# Patient Record
Sex: Male | Born: 1975 | Race: White | Hispanic: No | Marital: Single | Smoking: Former smoker
Health system: Southern US, Community
[De-identification: ages and names within clinical notes are randomized; demographics above are authoritative.]

## PROBLEM LIST (undated history)

## (undated) DIAGNOSIS — M549 Dorsalgia, unspecified: Secondary | ICD-10-CM

## (undated) DIAGNOSIS — F1010 Alcohol abuse, uncomplicated: Secondary | ICD-10-CM

## (undated) DIAGNOSIS — F1910 Other psychoactive substance abuse, uncomplicated: Secondary | ICD-10-CM

## (undated) DIAGNOSIS — I10 Essential (primary) hypertension: Secondary | ICD-10-CM

## (undated) DIAGNOSIS — K219 Gastro-esophageal reflux disease without esophagitis: Secondary | ICD-10-CM

## (undated) DIAGNOSIS — K759 Inflammatory liver disease, unspecified: Secondary | ICD-10-CM

## (undated) HISTORY — DX: Dorsalgia, unspecified: M54.9

---

## 2006-10-24 ENCOUNTER — Emergency Department: Payer: Self-pay | Admitting: Emergency Medicine

## 2006-10-30 ENCOUNTER — Emergency Department: Payer: Self-pay

## 2006-11-01 ENCOUNTER — Emergency Department: Payer: Self-pay | Admitting: Internal Medicine

## 2009-01-02 ENCOUNTER — Emergency Department: Payer: Self-pay | Admitting: Emergency Medicine

## 2009-01-15 ENCOUNTER — Ambulatory Visit: Payer: Self-pay

## 2009-03-10 ENCOUNTER — Emergency Department: Payer: Self-pay

## 2009-08-02 ENCOUNTER — Emergency Department: Payer: Self-pay | Admitting: Emergency Medicine

## 2010-01-25 ENCOUNTER — Emergency Department: Payer: Self-pay | Admitting: Emergency Medicine

## 2010-02-27 ENCOUNTER — Emergency Department: Payer: Self-pay | Admitting: Emergency Medicine

## 2010-03-02 ENCOUNTER — Ambulatory Visit: Payer: Self-pay | Admitting: Orthopedic Surgery

## 2010-03-21 ENCOUNTER — Emergency Department: Payer: Self-pay | Admitting: Emergency Medicine

## 2011-08-04 ENCOUNTER — Emergency Department (HOSPITAL_COMMUNITY): Payer: 59

## 2011-08-04 ENCOUNTER — Encounter (HOSPITAL_COMMUNITY): Payer: Self-pay

## 2011-08-04 ENCOUNTER — Observation Stay (HOSPITAL_COMMUNITY)
Admission: EM | Admit: 2011-08-04 | Discharge: 2011-08-05 | DRG: 918 | Disposition: A | Discharge status: Home / Self Care | Payer: 59 | Attending: Family Medicine | Admitting: Family Medicine

## 2011-08-04 DIAGNOSIS — T426X4A Poisoning by other antiepileptic and sedative-hypnotic drugs, undetermined, initial encounter: Secondary | ICD-10-CM

## 2011-08-04 DIAGNOSIS — Z720 Tobacco use: Secondary | ICD-10-CM

## 2011-08-04 DIAGNOSIS — F17200 Nicotine dependence, unspecified, uncomplicated: Secondary | ICD-10-CM | POA: Insufficient documentation

## 2011-08-04 DIAGNOSIS — F1010 Alcohol abuse, uncomplicated: Secondary | ICD-10-CM | POA: Diagnosis present

## 2011-08-04 DIAGNOSIS — T4271XA Poisoning by unspecified antiepileptic and sedative-hypnotic drugs, accidental (unintentional), initial encounter: Secondary | ICD-10-CM

## 2011-08-04 DIAGNOSIS — Y92009 Unspecified place in unspecified non-institutional (private) residence as the place of occurrence of the external cause: Secondary | ICD-10-CM | POA: Insufficient documentation

## 2011-08-04 DIAGNOSIS — M549 Dorsalgia, unspecified: Secondary | ICD-10-CM | POA: Diagnosis present

## 2011-08-04 DIAGNOSIS — T40601A Poisoning by unspecified narcotics, accidental (unintentional), initial encounter: Principal | ICD-10-CM | POA: Diagnosis present

## 2011-08-04 HISTORY — DX: Other psychoactive substance abuse, uncomplicated: F19.10

## 2011-08-04 HISTORY — DX: Alcohol abuse, uncomplicated: F10.10

## 2011-08-04 LAB — CBC WITH DIFFERENTIAL/PLATELET
Basophils Absolute: 0 10*3/uL (ref 0.0–0.1)
Basophils Relative: 0 % (ref 0–1)
Eosinophils Absolute: 0.1 10*3/uL (ref 0.0–0.7)
HCT: 39.7 % (ref 39.0–52.0)
Hemoglobin: 13.7 g/dL (ref 13.0–17.0)
MCH: 30.1 pg (ref 26.0–34.0)
MCHC: 34.5 g/dL (ref 30.0–36.0)
Monocytes Absolute: 0.5 10*3/uL (ref 0.1–1.0)
Monocytes Relative: 3 % (ref 3–12)
Neutro Abs: 13.5 10*3/uL — ABNORMAL HIGH (ref 1.7–7.7)
RDW: 12.8 % (ref 11.5–15.5)

## 2011-08-04 LAB — ETHANOL: Alcohol, Ethyl (B): 78 mg/dL — ABNORMAL HIGH (ref 0–11)

## 2011-08-04 LAB — COMPREHENSIVE METABOLIC PANEL
AST: 40 U/L — ABNORMAL HIGH (ref 0–37)
Albumin: 4.4 g/dL (ref 3.5–5.2)
BUN: 15 mg/dL (ref 6–23)
Calcium: 8.8 mg/dL (ref 8.4–10.5)
Chloride: 102 mEq/L (ref 96–112)
Creatinine, Ser: 1.07 mg/dL (ref 0.50–1.35)
Total Protein: 7.7 g/dL (ref 6.0–8.3)

## 2011-08-04 LAB — RAPID URINE DRUG SCREEN, HOSP PERFORMED
Amphetamines: NOT DETECTED
Benzodiazepines: NOT DETECTED
Cocaine: NOT DETECTED
Opiates: NOT DETECTED
Tetrahydrocannabinol: NOT DETECTED

## 2011-08-04 LAB — ACETAMINOPHEN LEVEL: Acetaminophen (Tylenol), Serum: <15 ug/mL (ref 10–30)

## 2011-08-04 MED ORDER — NALOXONE HCL 0.4 MG/ML IJ SOLN
0.2500 mg/h | INTRAVENOUS | Status: DC
  Administered 2011-08-04: 0.25 mg/h via INTRAVENOUS
  Filled 2011-08-04: qty 2.5

## 2011-08-04 MED ORDER — NALOXONE HCL 0.4 MG/ML IJ SOLN
INTRAMUSCULAR | Status: AC
  Filled 2011-08-04: qty 2

## 2011-08-04 NOTE — ED Provider Notes (Signed)
36 year old male was brought in by EMS after being found unresponsive. He relates that he took 2 Roxicodone 20 mg tablets and had consumed 8 beers. EMS reports that he was apneic or only minimal respirations which improved slightly with the first dose of Narcan 2 mg and then improved dramatically the second dose of Narcan 2 mg. Patient denies other drug use and denies other ingestion. He states he normally does not take the Roxicodone. He is currently awake, alert, oriented with an unremarkable physical exam. However, half-life of Roxicodone is much greater than the half-life of Narcan, so he will need to be admitted to a step down unit on a Narcan drip until it has had a chance to metabolize.  ECG shows normal sinus rhythm with a rate of 87, no ectopy. Normal axis. Normal P wave. Normal QRS. Normal intervals. Normal ST and T waves. Impression: normal ECG. No prior ECG available for comparison.   Dione Booze, MD 08/06/11 2255

## 2011-08-04 NOTE — ED Provider Notes (Signed)
History     CSN: [id number]  Arrival date & time 08/04/11  1946   First MD Initiated Contact with Patient 08/04/11 1950      Chief Complaint  Patient presents with  . Drug Overdose    (Consider location/radiation/quality/duration/timing/severity/associated sxs/prior treatment) Patient is a 36 y.o. male presenting with Overdose. The history is provided by the patient and the EMS personnel.  Drug Overdose This is a new problem. The current episode started today. Episode frequency: once. The problem has been rapidly improving. Associated symptoms include nausea. Pertinent negatives include no abdominal pain, chest pain, chills, diaphoresis, fever, headaches, myalgias, neck pain, numbness, rash, urinary symptoms, vertigo, vomiting or weakness. Nothing aggravates the symptoms. Treatments tried: Pt given Narcan 2 mg with minimal response and an additional 2 mg with significant response via EMS. The treatment provided significant relief.  Pt reports taking Roxicodone and drinking 8 beers this afternoon.  He was found unresponsive by wife.  Pt without respiratory efforts at time of EMS arrival.  BVM was applied.  Pt had nml pulse.  He was given Narcan 2 mg with minimal response.  He was then given Narcan 4 mg with significant response.  Pt alert at time of arrival to ED.  Past Medical History  Diagnosis Date  . ETOH abuse   . Polysubstance abuse     History reviewed. No pertinent past surgical history.  No family history on file.  History  Substance Use Topics  . Smoking status: Current Everyday Smoker  . Smokeless tobacco: Not on file  . Alcohol Use: Yes      Review of Systems  Constitutional: Negative for fever, chills and diaphoresis.  HENT: Negative for neck pain and neck stiffness.   Eyes: Negative for visual disturbance.  Respiratory: Negative.   Cardiovascular: Negative for chest pain and palpitations.  Gastrointestinal: Positive for nausea. Negative for vomiting and  abdominal pain.  Genitourinary: Negative.   Musculoskeletal: Negative.  Negative for myalgias.  Skin: Negative for rash.  Neurological: Negative for dizziness, vertigo, syncope, weakness, numbness and headaches.  Psychiatric/Behavioral: Negative for suicidal ideas and self-injury.  All other systems reviewed and are negative.    Allergies  Review of patient's allergies indicates no known allergies.  Home Medications   Current Outpatient Rx  Name Route Sig Dispense Refill  . NAPROXEN 500 MG PO TABS Oral Take 500 mg by mouth 2 (two) times daily with a meal.    . NAPROXEN SODIUM 220 MG PO CAPS Oral Take 1 capsule by mouth 2 (two) times daily.      BP 113/68  Pulse 73  Temp 97.2 F (36.2 C) (Oral)  Resp 14  SpO2 96%  Physical Exam  Nursing note and vitals reviewed. Constitutional: He is oriented to person, place, and time. He appears well-developed and well-nourished. No distress. Cervical collar and backboard in place.  HENT:  Head: Normocephalic and atraumatic.  Eyes: Conjunctivae and EOM are normal. Pupils are equal, round, and reactive to light.  Neck: Neck supple. No spinous process tenderness and no muscular tenderness present.  Cardiovascular: Normal rate, regular rhythm, normal heart sounds and intact distal pulses.   Pulmonary/Chest: Effort normal and breath sounds normal. He has no wheezes. He has no rales. He exhibits no tenderness.  Abdominal: Soft. He exhibits no distension. There is no tenderness.  Musculoskeletal: Normal range of motion. He exhibits no edema and no tenderness.       Cervical back: Normal.       Thoracic back:  Normal.       Lumbar back: Normal.  Neurological: He is alert and oriented to person, place, and time.  Skin: Skin is warm and dry.  Psychiatric: He expresses no suicidal ideation.    ED Course  Procedures (including critical care time)  Labs Reviewed  CBC WITH DIFFERENTIAL - Abnormal; Notable for the following:    WBC 15.0 (*)      Neutrophils Relative 90 (*)     Neutro Abs 13.5 (*)     Lymphocytes Relative 6 (*)     All other components within normal limits  COMPREHENSIVE METABOLIC PANEL - Abnormal; Notable for the following:    Glucose, Bld 181 (*)     AST 40 (*)     ALT 91 (*)     Total Bilirubin 0.2 (*)     GFR calc non Af Amer 88 (*)     All other components within normal limits  SALICYLATE LEVEL - Abnormal; Notable for the following:    Salicylate Lvl <2.0 (*)     All other components within normal limits  ETHANOL - Abnormal; Notable for the following:    Alcohol, Ethyl (B) 78 (*)     All other components within normal limits  ACETAMINOPHEN LEVEL  URINE RAPID DRUG SCREEN (HOSP PERFORMED)   Dg Chest 2 View  08/04/2011  *RADIOLOGY REPORT*  Clinical Data: Drug overdose.  Cigarette smoker.  CHEST - 2 VIEW  Comparison: None.  Findings:  Cardiopericardial silhouette within normal limits. Mediastinal contours normal. Trachea midline.  No airspace disease or effusion.  IMPRESSION: No active cardiopulmonary disease.  Original Report Authenticated By: Andreas Newport, M.D.     1. Narcotic overdose       MDM  36 yo male who presents to the ED via EMS for narcotic overdose.  Pt reports taking Roxicodone and drinking 8 beers this afternoon.  He was found unresponsive by wife.  Pt without respiratory efforts at time of EMS arrival.  BVM was applied.  Pt had nml pulse.  He was given Narcan 2 mg with minimal response.  He was then given Narcan 4 mg with significant response.  Pt alert and oriented at time of arrival to ED.  AF, VSS.  Physical exam as documented above.  No injuries identified.  Will get labs including LFT's, acetaminophen, salicylate, EtOH levels.  Due to concern for relapsing sx will start on Narcan gtt.    LFT's mildly elevated possibly secondary to EtOH abuse as acetaminophen level negative.  Pt has remained alert and oriented during ED stay.  Pt will require observation for relapsing sx and will admit  to step-down unit.        Cherre Robins, MD 08/05/11 [phone]

## 2011-08-04 NOTE — ED Notes (Signed)
Pharmacy notified for Narcan infusion

## 2011-08-04 NOTE — ED Notes (Signed)
Wife went to pick patient up from work and patient was violent and belligerent with her. Wife drove off and returned 5-10 minutes later to find patient on sidewalk, unresponsive and not breathing. CPR initiated by wife. At EMS arrival, patient was found to be unresponsive with a strong carotid pulse and RR of 0. Nasal trumpet inserted and assisted with ventilations. Narcan 4 mg given. Patient immediately AAOx4. Admits to consuming 2- 10 mg tablets of Roxicodone and several beers. Patient has a hx ETOH and polysubstance abuse. Patient has no complaints of pain or discomfort. No obvious injuries or deformities noted.

## 2011-08-04 NOTE — ED Notes (Signed)
Admitting MD at bedside.

## 2011-08-05 LAB — CBC
MCV: 88 fL (ref 78.0–100.0)
Platelets: 193 10*3/uL (ref 150–400)
RBC: 4.43 MIL/uL (ref 4.22–5.81)
WBC: 6.4 10*3/uL (ref 4.0–10.5)

## 2011-08-05 LAB — BASIC METABOLIC PANEL
CO2: 27 mEq/L (ref 19–32)
Calcium: 9 mg/dL (ref 8.4–10.5)
Chloride: 103 mEq/L (ref 96–112)
GFR calc Af Amer: >90 mL/min (ref >90)
Sodium: 138 mEq/L (ref 135–145)

## 2011-08-05 MED ORDER — SODIUM CHLORIDE 0.9 % IJ SOLN
3.0000 mL | Freq: Two times a day (BID) | INTRAMUSCULAR | Status: DC
  Administered 2011-08-05: 3 mL via INTRAVENOUS

## 2011-08-05 MED ORDER — ONDANSETRON HCL 4 MG/2ML IJ SOLN: 4.0000 mg | Freq: Four times a day (QID) | INTRAMUSCULAR | Status: DC | PRN

## 2011-08-05 MED ORDER — ONDANSETRON HCL 4 MG PO TABS: 4.0000 mg | ORAL_TABLET | Freq: Four times a day (QID) | ORAL | Status: DC | PRN

## 2011-08-05 MED ORDER — SODIUM CHLORIDE 0.9 % IV SOLN
INTRAVENOUS | Status: DC
  Administered 2011-08-05: 01:00:00 via INTRAVENOUS

## 2011-08-05 NOTE — H&P (Signed)
Thomas Dorsey is an 36 y.o. male.   Chief Complaint: Drug overdose HPI: A 36 year old gentleman brought in by his wife with altered mental status. Patient apparently took some oxycodone tonight. He also took 8 beers in the same period. He was completely obtunded when he came to the emergency room. He known to have polysubstance abuse. Patient was given Narcan on the way to the hospital. And was also started on narcan drip in the ER. He is now awake most of the weak and slightly drowsy. Patient is being admitted for observation.  Past Medical History  Diagnosis Date  . ETOH abuse   . Polysubstance abuse     History reviewed. No pertinent past surgical history.  No family history on file. Social History:  reports that he has been smoking.  He does not have any smokeless tobacco history on file. He reports that he drinks alcohol. His drug history not on file.  Allergies: No Known Allergies  Medications Prior to Admission  Medication Sig Dispense Refill  . naproxen (NAPROSYN) 500 MG tablet Take 500 mg by mouth 2 (two) times daily with a meal.      . Naproxen Sodium (ALEVE) 220 MG CAPS Take 1 capsule by mouth 2 (two) times daily.        Results for orders placed during the hospital encounter of 08/04/11 (from the past 48 hour(s))  URINE RAPID DRUG SCREEN (HOSP PERFORMED)     Status: Normal   Collection Time   08/04/11  8:00 PM      Component Value Range Comment   Opiates NONE DETECTED  NONE DETECTED    Cocaine NONE DETECTED  NONE DETECTED    Benzodiazepines NONE DETECTED  NONE DETECTED    Amphetamines NONE DETECTED  NONE DETECTED    Tetrahydrocannabinol NONE DETECTED  NONE DETECTED    Barbiturates NONE DETECTED  NONE DETECTED   CBC WITH DIFFERENTIAL     Status: Abnormal   Collection Time   08/04/11  8:02 PM      Component Value Range Comment   WBC 15.0 (*) 4.0 - 10.5 K/uL    RBC 4.55  4.22 - 5.81 MIL/uL    Hemoglobin 13.7  13.0 - 17.0 g/dL    HCT 40.9  81.1 - 91.4 %    MCV 87.3   78.0 - 100.0 fL    MCH 30.1  26.0 - 34.0 pg    MCHC 34.5  30.0 - 36.0 g/dL    RDW 78.2  95.6 - 21.3 %    Platelets 204  150 - 400 K/uL    Neutrophils Relative 90 (*) 43 - 77 %    Neutro Abs 13.5 (*) 1.7 - 7.7 K/uL    Lymphocytes Relative 6 (*) 12 - 46 %    Lymphs Abs 0.9  0.7 - 4.0 K/uL    Monocytes Relative 3  3 - 12 %    Monocytes Absolute 0.5  0.1 - 1.0 K/uL    Eosinophils Relative 0  0 - 5 %    Eosinophils Absolute 0.1  0.0 - 0.7 K/uL    Basophils Relative 0  0 - 1 %    Basophils Absolute 0.0  0.0 - 0.1 K/uL   COMPREHENSIVE METABOLIC PANEL     Status: Abnormal   Collection Time   08/04/11  8:02 PM      Component Value Range Comment   Sodium 139  135 - 145 mEq/L    Potassium 3.5  3.5 - 5.1  mEq/L    Chloride 102  96 - 112 mEq/L    CO2 21  19 - 32 mEq/L    Glucose, Bld 181 (*) 70 - 99 mg/dL    BUN 15  6 - 23 mg/dL    Creatinine, Ser 1.61  0.50 - 1.35 mg/dL    Calcium 8.8  8.4 - 09.6 mg/dL    Total Protein 7.7  6.0 - 8.3 g/dL    Albumin 4.4  3.5 - 5.2 g/dL    AST 40 (*) 0 - 37 U/L    ALT 91 (*) 0 - 53 U/L    Alkaline Phosphatase 92  39 - 117 U/L    Total Bilirubin 0.2 (*) 0.3 - 1.2 mg/dL    GFR calc non Af Amer 88 (*) >90 mL/min    GFR calc Af Amer >90  >90 mL/min   ACETAMINOPHEN LEVEL     Status: Normal   Collection Time   08/04/11  8:02 PM      Component Value Range Comment   Acetaminophen (Tylenol), Serum <15.0  10 - 30 ug/mL   SALICYLATE LEVEL     Status: Abnormal   Collection Time   08/04/11  8:02 PM      Component Value Range Comment   Salicylate Lvl <2.0 (*) 2.8 - 20.0 mg/dL   ETHANOL     Status: Abnormal   Collection Time   08/04/11  9:14 PM      Component Value Range Comment   Alcohol, Ethyl (B) 78 (*) 0 - 11 mg/dL    Dg Chest 2 View  0/04/5407  *RADIOLOGY REPORT*  Clinical Data: Drug overdose.  Cigarette smoker.  CHEST - 2 VIEW  Comparison: None.  Findings:  Cardiopericardial silhouette within normal limits. Mediastinal contours normal. Trachea midline.  No  airspace disease or effusion.  IMPRESSION: No active cardiopulmonary disease.  Original Report Authenticated By: Andreas Newport, M.D.    Review of Systems  Constitutional: Negative.   HENT: Negative.   Eyes: Negative.   Respiratory: Negative.   Cardiovascular: Negative.   Gastrointestinal: Negative.   Genitourinary: Negative.   Musculoskeletal: Negative.   Skin: Negative.   Neurological: Positive for loss of consciousness.  Endo/Heme/Allergies: Negative.   Psychiatric/Behavioral: Negative.     Blood pressure 106/64, pulse 68, temperature 97.8 F (36.6 C), temperature source Oral, resp. rate 16, height 5\' 11"  (1.803 m), weight 77.6 kg (171 lb 1.2 oz), SpO2 98.00%. Physical Exam  Constitutional: He is oriented to person, place, and time. He appears well-developed and well-nourished.       disshevelled  HENT:  Head: Normocephalic and atraumatic.  Right Ear: External ear normal.  Left Ear: External ear normal.  Nose: Nose normal.  Mouth/Throat: Oropharynx is clear and moist.  Eyes: Conjunctivae and EOM are normal. Pupils are equal, round, and reactive to light.  Neck: Normal range of motion. Neck supple.  Cardiovascular: Normal rate, regular rhythm, normal heart sounds and intact distal pulses.   Respiratory: Effort normal and breath sounds normal.  GI: Soft. Bowel sounds are normal.  Musculoskeletal: Normal range of motion.  Neurological: He is alert and oriented to person, place, and time. He has normal reflexes.  Skin: Skin is warm and dry.  Psychiatric: He has a normal mood and affect. His behavior is normal. Judgment and thought content normal.     Assessment/Plan This 36 year old gentleman with narcotic overdose, alcohol abuse and tobacco abuse. He had inadvertent overdose of his narcotics. He obtained from a friend  but has apparently been on Percocet for low back pain.   Plan #1 narcotic overdose: The patient is now awake after narcan. We will also overnight and if he is  stable then we will discharge him home. Counseling will be given once again regarding substance use and abuse.  #2 Alcohol abuse: Counseling has also been provided. We will check for possible alcohol withdrawal if so we'll start the CIWA protocol.  #3 tobacco abuse: Patient has been counseled and offered him nicotine patch.  #4 history of low back pain: This is the reason that led the patient to take narcotics. Will advise patient to follow up with his primary care physician and to abide by the treatment plan GARBA,LAWAL 08/05/2011, 6:03 AM

## 2011-08-05 NOTE — Progress Notes (Signed)
Utilization review complete 

## 2011-08-05 NOTE — Progress Notes (Signed)
Pts assessment unchanged from this am. D/c'd home with family in stable condition

## 2011-08-05 NOTE — Clinical Social Work Psychosocial (Addendum)
    Clinical Social Work Department BRIEF PSYCHOSOCIAL ASSESSMENT 08/05/2011  Patient:  Yerger,CHRIS     Account Number:  [id number]     Admit date:  08/04/2011  Clinical Social Worker:  Doree Albee  Date/Time:  08/05/2011 02:35 PM  Referred by:  RN  Date Referred:  08/05/2011 Referred for  Substance Abuse   Other Referral:   Interview type:  Patient Other interview type:    PSYCHOSOCIAL DATA Living Status:  FAMILY Admitted from facility:   Level of care:   Primary support name:  Rhinehardt,Dawn Primary support relationship to patient:  SPOUSE Degree of support available:   strong    CURRENT CONCERNS Current Concerns  Other - See comment   Other Concerns:   substance abuse, alcohol and narcotics    SOCIAL WORK ASSESSMENT / PLAN CSW met with pt at bedside along with pt spouse with permission from pt. CSW and pt discussed the incident of patient hosptalization. Pt shared he had been drinking 7 beers when he took pain medicine from a coworker. Pt stated that he is usually prescirbed percocet hoewver had run out so a coworker gave him some roxy.    Pt and csw discussed the dangers of taking medications not prescribed to the patient with allergies, medicaiton interractions, and the dosage. CSW and pt also discussed the concerns of taking narcotics and alcohol. 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    CSW completed SBIRT with patient.    Pt denied having a current substance abuse problem and that it was an accident. Pt denied substance abuse resources. Pt denied suicidal or homicidal ideations. Pt denied all symptoms of depression.    Assessment/plan status:  No Further Intervention Required Other assessment/ plan:   Information/referral to community resources:   Offered substance abuse resources, pt declined     PATIENT'S/FAMILY'S RESPONSE TO PLAN OF CARE: Pt thanked csw for concern and support. Pt agreed to obstain from drinking alcohol with pain medicine.  Pt adamantly stated taking that much narcotic and drinking was an accident that got out of hand.

## 2011-08-05 NOTE — Discharge Summary (Signed)
Physician Discharge Summary  Patient ID: Thomas Dorsey MRN: [id number] DOB/AGE: 02/10/1975 36 y.o.  Admit date: 08/04/2011 Discharge date: 08/05/2011  Discharge Diagnoses:  Principal Problem:  *Narcotic overdose Active Problems:  Alcohol abuse  Tobacco abuse  Back pain  Discharged Condition: good  Hospital Course: From H&P:  36 year old gentleman brought in by his wife with altered mental status. Patient apparently took some oxycodone tonight. He also took 8 beers in the same period. He was completely obtunded when he came to the emergency room. He known to have polysubstance abuse. Patient was given Narcan on the way to the hospital. And was also started on narcan drip in the ER. He is now awake most of the weak and slightly drowsy. Patient is being admitted for observation. This 36 year old gentleman with narcotic overdose, alcohol abuse and tobacco abuse. He had inadvertent overdose of his narcotics. He obtained from a friend but has apparently been on Percocet for low back pain.   #1 narcotic overdose: The patient is now awake after narcan. We monitored him overnight and he remained stable. Will discharge home. Counseled to avoid tobacco, recreational substance abuse and mixing prescription meds with alcohol.  Pt verbalized understanding.    #2 Alcohol abuse: Counseling has also been provided to avoid alcohol use. Pt exhibited no signs of alcohol withdrawal during the hospital stay. The patient verbalized understanding.    #3 tobacco abuse: Patient has been counseled and offered him nicotine patch.   #4 history of low back pain: This is the reason that led the patient to take narcotics. Will advise patient to follow up with his primary care physician and to abide by the treatment plan and avoid self-medicating. The patient verbalized understanding.    Discharge Exam: Pt is anxious to leave the hospital. He says that he feels fine and is planning to go on vacation with wife.  He denies  CP, SOB, or any other symptoms.   Blood pressure 102/58, pulse 71, temperature 98.1 F (36.7 C), temperature source Oral, resp. rate 18, height 5\' 11"  (1.803 m), weight 77.6 kg (171 lb 1.2 oz), SpO2 98.00%. General - no distress, calm, cooperative, A&Ox4 Heent - NCAT, MMM Neck - supple, no JVD Lungs - BBS cTA CV - normal s1, s2 sounds Abd - soft, nondistended, nontender, no masses Ext - no CCE Neuro - nonfocal  Disposition: Home with wife, close outpatient follow up recommended  Discharge Orders    Future Orders Please Complete By Expires   Increase activity slowly      Discharge instructions      Comments:   Make sure and follow up with your primary care physician in next 2 weeks time for follow up.  Avoid alcohol Return if symptoms recur, worsen or new problems develop.   Other Restrictions      Comments:   Avoid alcohol and avoid any recreational substances.     Medication List  As of 08/05/2011  1:20 PM   STOP taking these medications         ALEVE 220 MG Caps         TAKE these medications         naproxen 500 MG tablet   Commonly known as: NAPROSYN   Take 500 mg by mouth 2 (two) times daily with a meal.           Follow-up Information    Follow up with See your primary care physician. Schedule an appointment as soon as possible for a visit  in 2 weeks. [facility] Follow Up)         32 mins spent preparing discharge, counseling with patient  Signed: Standley Dakins MD 08/05/2011, 1:20 PM [facility] [place], [place]

## 2011-08-05 NOTE — ED Provider Notes (Signed)
CRITICAL CARE Performed by: Dione Booze   Total critical care time: 40 minutes  Critical care time was exclusive of separately billable procedures and treating other patients.  Critical care was necessary to treat or prevent imminent or life-threatening deterioration.  Critical care was time spent personally by me on the following activities: development of treatment plan with patient and/or surrogate as well as nursing, discussions with consultants, evaluation of patient's response to treatment, examination of patient, obtaining history from patient or surrogate, ordering and performing treatments and interventions, ordering and review of laboratory studies, ordering and review of radiographic studies, pulse oximetry and re-evaluation of patient's condition.   I saw and evaluated the patient, reviewed the resident's note and I agree with the findings and plan.   Dione Booze, MD 08/05/11 [phone]

## 2012-01-04 HISTORY — PX: HAND SURGERY: SHX662

## 2012-01-23 IMAGING — CR RIGHT HAND - COMPLETE 3+ VIEW
1 series · 3 of 3 positions shown · non-contrast
Comparison: none

REASON FOR EXAM: deformity
COMMENTS:

PROCEDURE:     DXR - DXR HAND RT COMPLETE W/OBLIQUES  - February 27, 2010 [DATE]
RESULT:
A fracture is identified along the distal shaft of the fourth metacarpal.
The distal fracture fragment demonstrates volar angulation and displacement.
No further fractures or dislocations are appreciated.

[Series 1: view not recorded · 0.17mm/px · 3 of 3 slices shown]
[im 1/3]
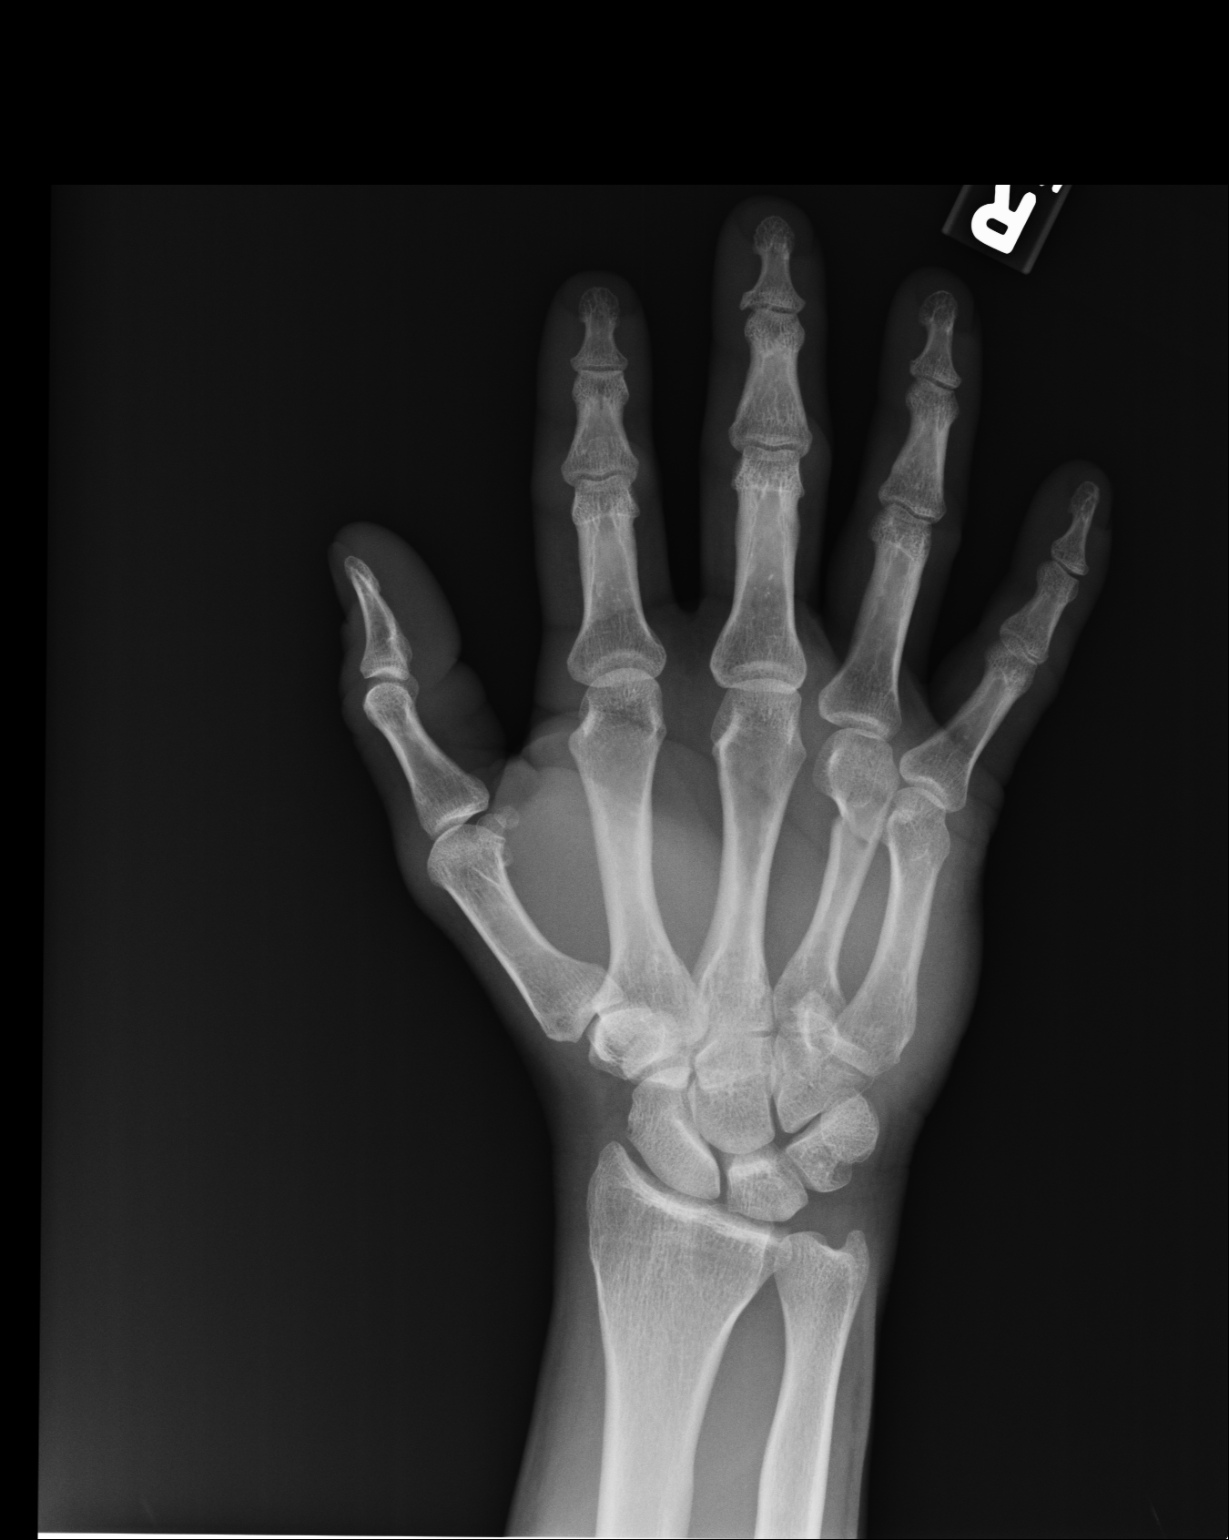
[im 2/3]
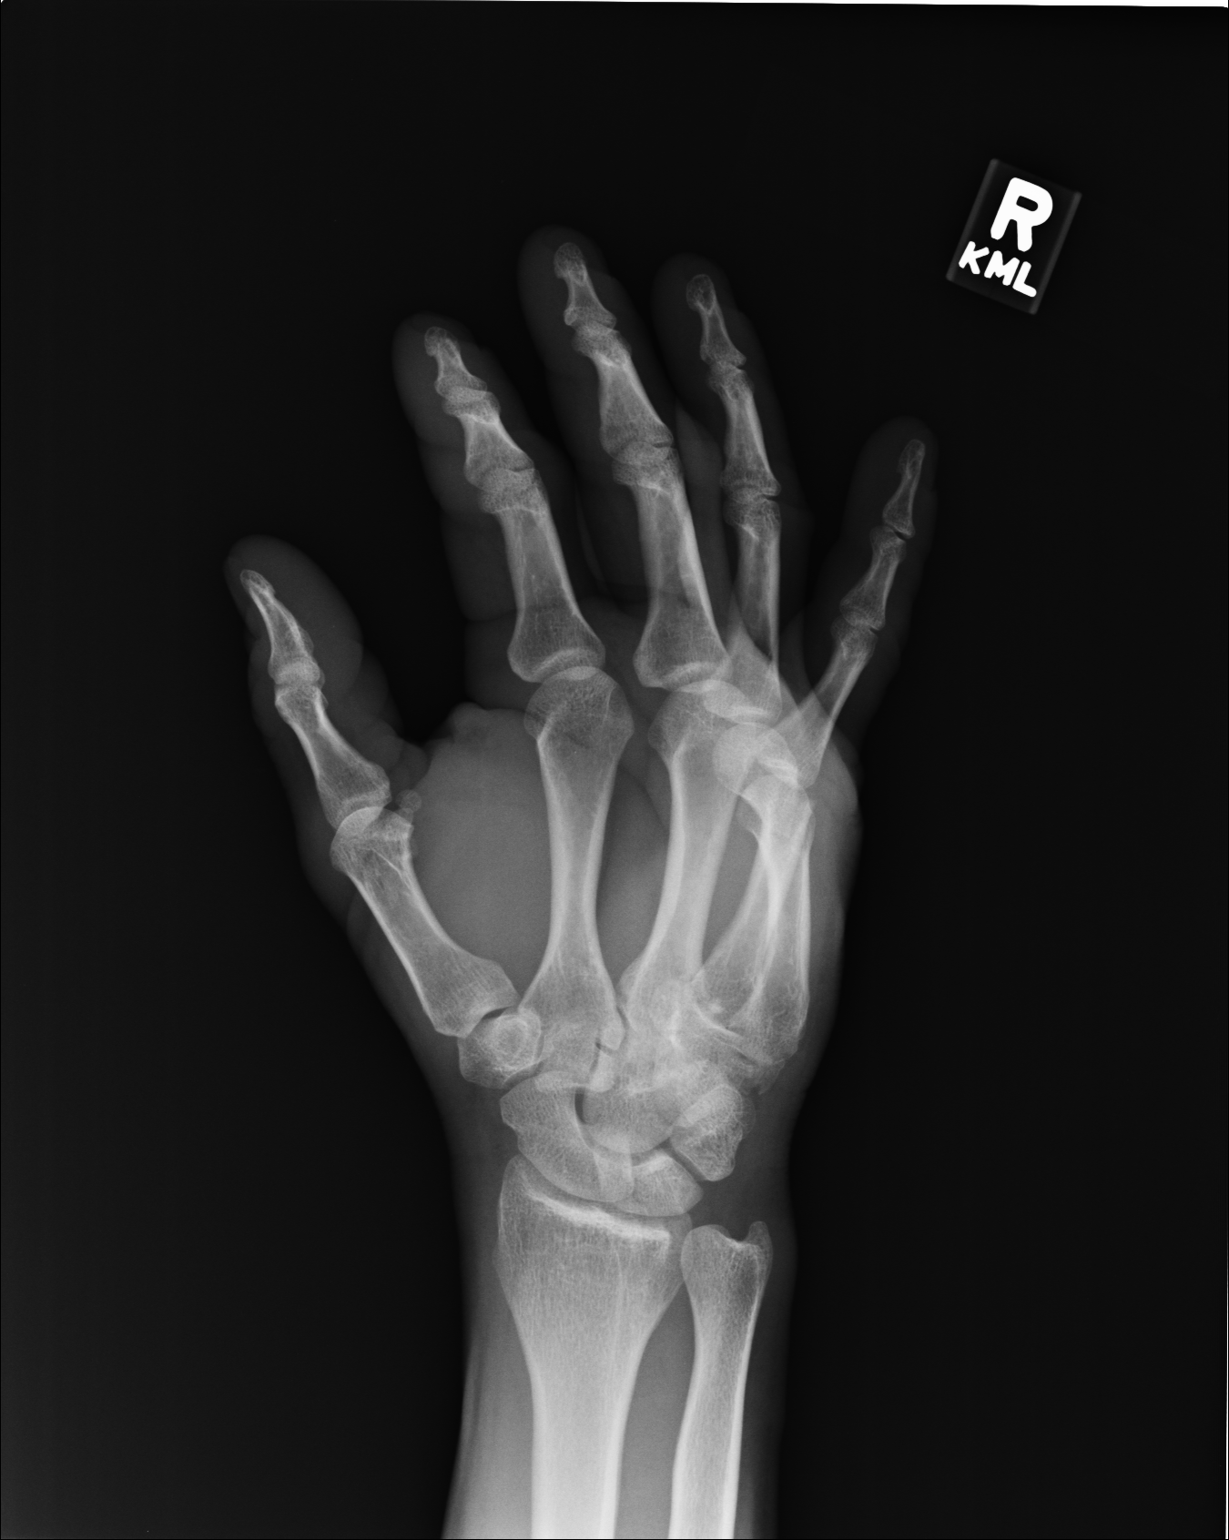
[im 3/3]
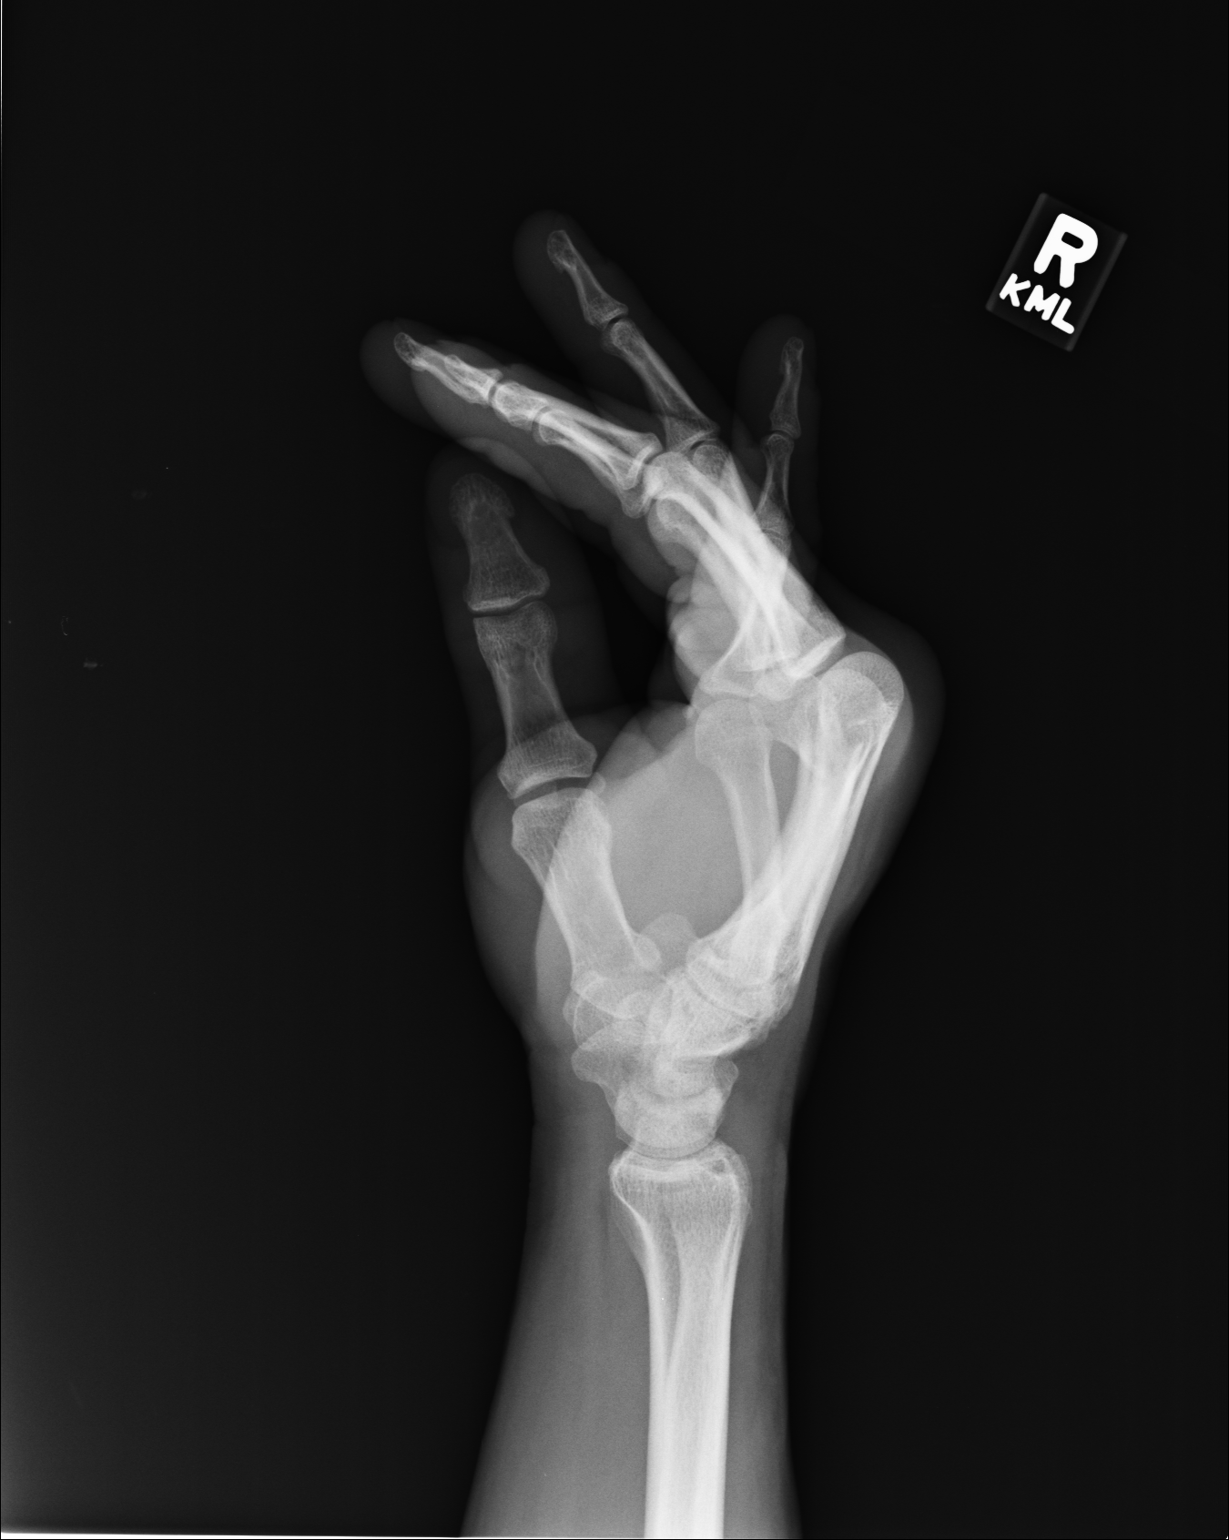

[3 of 3 positions shown; findings below may reference images not displayed]

IMPRESSION: 1. Distal fourth metacarpal fracture.

## 2012-06-21 ENCOUNTER — Emergency Department: Payer: Self-pay | Admitting: Emergency Medicine

## 2012-06-21 LAB — COMPREHENSIVE METABOLIC PANEL
Bilirubin,Total: 0.2 mg/dL (ref 0.2–1.0)
Calcium, Total: 8.1 mg/dL — ABNORMAL LOW (ref 8.5–10.1)
Co2: 23 mmol/L (ref 21–32)
Creatinine: 1.27 mg/dL (ref 0.60–1.30)
EGFR (African American): >60
Glucose: 179 mg/dL — ABNORMAL HIGH (ref 65–99)
Osmolality: 279 (ref 275–301)

## 2012-06-21 LAB — CBC
HGB: 13.4 g/dL (ref 13.0–18.0)
MCH: 30.5 pg (ref 26.0–34.0)
Platelet: 182 10*3/uL (ref 150–440)
RBC: 4.41 10*6/uL (ref 4.40–5.90)
RDW: 12.5 % (ref 11.5–14.5)
WBC: 4.8 10*3/uL (ref 3.8–10.6)

## 2012-06-21 LAB — ETHANOL
Ethanol %: 0.064 % (ref 0.000–0.080)
Ethanol: 64 mg/dL

## 2012-06-21 LAB — TROPONIN I: Troponin-I: <0.02 ng/mL

## 2012-06-21 LAB — CK: CK, Total: 81 U/L (ref 35–232)

## 2012-07-14 ENCOUNTER — Emergency Department (HOSPITAL_COMMUNITY)
Admission: EM | Admit: 2012-07-14 | Discharge: 2012-07-14 | Disposition: A | Discharge status: Home / Self Care | Payer: 59 | Attending: Emergency Medicine | Admitting: Emergency Medicine

## 2012-07-14 ENCOUNTER — Encounter (HOSPITAL_COMMUNITY): Payer: Self-pay | Admitting: Emergency Medicine

## 2012-07-14 ENCOUNTER — Emergency Department (HOSPITAL_COMMUNITY): Payer: 59

## 2012-07-14 DIAGNOSIS — Y9229 Other specified public building as the place of occurrence of the external cause: Secondary | ICD-10-CM | POA: Insufficient documentation

## 2012-07-14 DIAGNOSIS — Z791 Long term (current) use of non-steroidal anti-inflammatories (NSAID): Secondary | ICD-10-CM | POA: Insufficient documentation

## 2012-07-14 DIAGNOSIS — Z79899 Other long term (current) drug therapy: Secondary | ICD-10-CM | POA: Insufficient documentation

## 2012-07-14 DIAGNOSIS — G5631 Lesion of radial nerve, right upper limb: Secondary | ICD-10-CM

## 2012-07-14 DIAGNOSIS — F17200 Nicotine dependence, unspecified, uncomplicated: Secondary | ICD-10-CM | POA: Insufficient documentation

## 2012-07-14 DIAGNOSIS — G5630 Lesion of radial nerve, unspecified upper limb: Secondary | ICD-10-CM | POA: Insufficient documentation

## 2012-07-14 DIAGNOSIS — R112 Nausea with vomiting, unspecified: Secondary | ICD-10-CM | POA: Insufficient documentation

## 2012-07-14 DIAGNOSIS — R209 Unspecified disturbances of skin sensation: Secondary | ICD-10-CM | POA: Insufficient documentation

## 2012-07-14 DIAGNOSIS — S0093XA Contusion of unspecified part of head, initial encounter: Secondary | ICD-10-CM

## 2012-07-14 DIAGNOSIS — W1809XA Striking against other object with subsequent fall, initial encounter: Secondary | ICD-10-CM | POA: Insufficient documentation

## 2012-07-14 DIAGNOSIS — S0003XA Contusion of scalp, initial encounter: Secondary | ICD-10-CM | POA: Insufficient documentation

## 2012-07-14 DIAGNOSIS — Y9389 Activity, other specified: Secondary | ICD-10-CM | POA: Insufficient documentation

## 2012-07-14 NOTE — ED Notes (Signed)
Hit head on Tuesday when chair leg broke-- fell backward and hit head on dresser. States lost consciousness for an unknown amount of time-- bit tongue at same time-- today right hand feels numb and "fingers won't work" -- able to squeeze -- equal grips-- unable to flex wrist without pain and "makes fingers spasm" .  Also complains of head being numb -- occipital area - with small hematoma.

## 2012-07-14 NOTE — ED Provider Notes (Signed)
History    CSN: [id number] Arrival date & time 07/14/12  4098  None    Chief Complaint  Patient presents with  . right hand injury   . Head Injury   (Consider location/radiation/quality/duration/timing/severity/associated sxs/prior Treatment) Patient is a 37 y.o. male presenting with head injury.  Head Injury Associated symptoms: nausea, numbness and vomiting ( 1x tuesday)   Associated symptoms: no headaches    Pt is a 37yo male presenting today with right hand weakness and tingling that started this morning.  Pt reports a fall on Tuesday 7/8 when he was sitting in a hotel room chair, leg broke and he hit the back of his head on a desk.  Pt states he lost consciousness for an unknown amount of time, pt was alone.  States when he woke up his head was a little sore with a "goose egg" but did not feel like needed to come to the hospital at that time.  Reports 1 episode of NBNB emesis the next morning.  Today pt notice numbness and weakness in right head, unable to open fully.  Denies pain.  Denies additional injury to head but does report previous ORIF several years ago of right hand.  Has not had complications since initial hand injury.  Pt is right handed.   Past Medical History  Diagnosis Date  . ETOH abuse   . Polysubstance abuse    Past Surgical History  Procedure Laterality Date  . Hand sugery     No family history on file. History  Substance Use Topics  . Smoking status: Current Every Day Smoker -- 1.00 packs/day  . Smokeless tobacco: Not on file  . Alcohol Use: No     Comment: currently in recovery---- sober for 6 months    Review of Systems  Constitutional: Negative for fever and chills.  Eyes: Negative for visual disturbance.  Gastrointestinal: Positive for nausea and vomiting ( 1x tuesday).  Skin: Negative for wound.  Neurological: Positive for weakness ( right hand) and numbness. Negative for dizziness, light-headedness and headaches.  All other systems reviewed  and are negative.    Allergies  Review of patient's allergies indicates no known allergies.  Home Medications   Current Outpatient Rx  Name  Route  Sig  Dispense  Refill  . esomeprazole (NEXIUM) 40 MG capsule   Oral   Take 40 mg by mouth daily.         . naproxen (NAPROSYN) 500 MG tablet   Oral   Take 500 mg by mouth 2 (two) times daily with a meal.         . traMADol (ULTRAM) 50 MG tablet   Oral   Take 50 mg by mouth every 6 (six) hours as needed for pain.          BP 117/65  Pulse 80  Temp(Src) 98.7 F (37.1 C) (Oral)  Resp 16  SpO2 100% Physical Exam  Nursing note and vitals reviewed. Constitutional: He appears well-developed and well-nourished. No distress.  Pt resting comfortably on exam bed. NAD  HENT:  Head: Normocephalic and atraumatic.    Eyes: Conjunctivae are normal. No scleral icterus.  Neck: Normal range of motion. Neck supple.  No midline bone tenderness, no crepitus or step-offs.     Cardiovascular: Normal rate, regular rhythm and normal heart sounds.   Pulmonary/Chest: Effort normal and breath sounds normal.  Musculoskeletal: He exhibits no edema and no tenderness.       Right wrist: He exhibits decreased  range of motion ( limited extention of right hand at wrist). He exhibits no tenderness, no swelling, no crepitus, no deformity and no laceration.  Limited extension of right hand at wrist, worst in 3rd and 4th digit  Neurological: He is alert.  Skin: Skin is warm and dry. He is not diaphoretic.  Psychiatric: He has a normal mood and affect. His behavior is normal.    ED Course  Procedures (including critical care time) Labs Reviewed - No data to display Ct Head Wo Contrast  07/14/2012   *RADIOLOGY REPORT*  Clinical Data:  Fall.  Head injury.  Headache  CT HEAD WITHOUT CONTRAST  Technique:  Contiguous axial images were obtained from the base of the skull through the vertex without contrast  Comparison:  None.  Findings:  The brain has a  normal appearance without evidence for hemorrhage, acute infarction, hydrocephalus, or mass lesion.  There is no extra axial fluid collection.  The skull and paranasal sinuses are normal.  IMPRESSION: Normal CT of the head without contrast.   Original Report Authenticated By: Janeece Riggers, M.D.   Dg Hand Complete Right  07/14/2012   *RADIOLOGY REPORT*  Clinical Data: Fall.  Paralysis of right hand.  Remote fractures.  RIGHT HAND - COMPLETE 3+ VIEW  Comparison: None.  Findings: Remote fractures of the fourth and fifth metacarpals are evident.  No acute bone or soft tissue abnormalities are present. No radiopaque foreign body is present.  The carpal bones are located.  IMPRESSION:  1.  Remote fractures of the fourth and fifth metacarpals with chronic deformities. 2.  No acute abnormality.   Original Report Authenticated By: Marin Roberts, M.D.   1. Acute radial nerve palsy, right   2. Head contusion, initial encounter     MDM  Will get head CT due to recent hx of head trauma.  If negative, symptoms likely due to radial nerve palsy.    Head CT: nl Right hand: no acute abnormality   Will discharge pt home and have her f/u with [facility] or previously established health care provider in 2-3 days if symptoms persist.  May need referral to hand specialist.  Return precautions given. Pt verbalized understanding and agreement with tx plan. Vitals: unremarkable. Discharged in stable condition.    Discussed pt with attending during ED encounter.   Junius Finner, PA-C 07/14/12 1003

## 2012-07-14 NOTE — ED Provider Notes (Signed)
Medical screening examination/treatment/procedure(s) were performed by non-physician practitioner and as supervising physician I was immediately available for consultation/collaboration.  Flint Melter, MD 07/14/12 2033

## 2013-06-29 IMAGING — CR DG CHEST 2V
2 series · 2 of 2 positions shown · non-contrast
Comparison: None.

CLINICAL DATA: Drug overdose.  Cigarette smoker.

CHEST - 2 VIEW

[w chest lat]
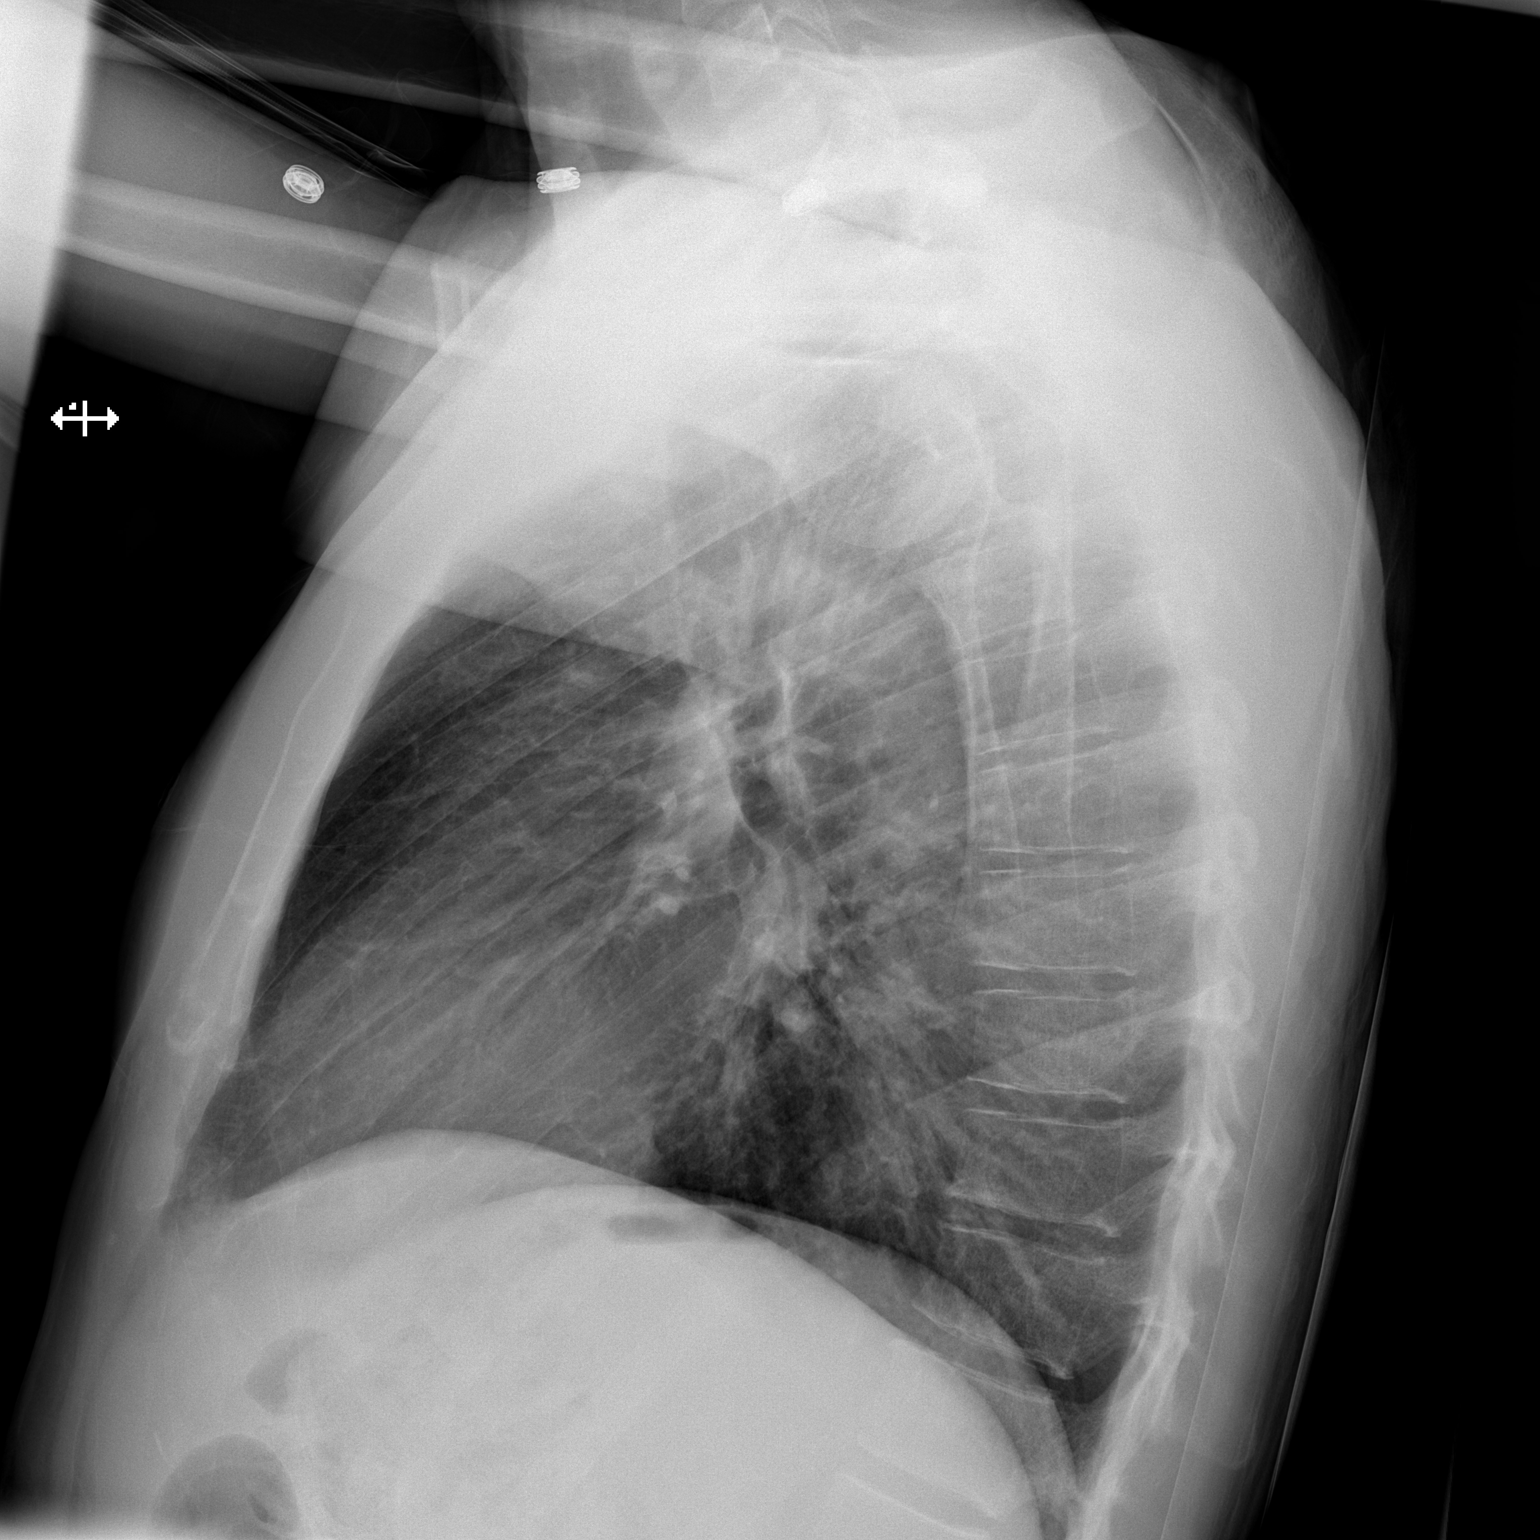

[w chest pa]
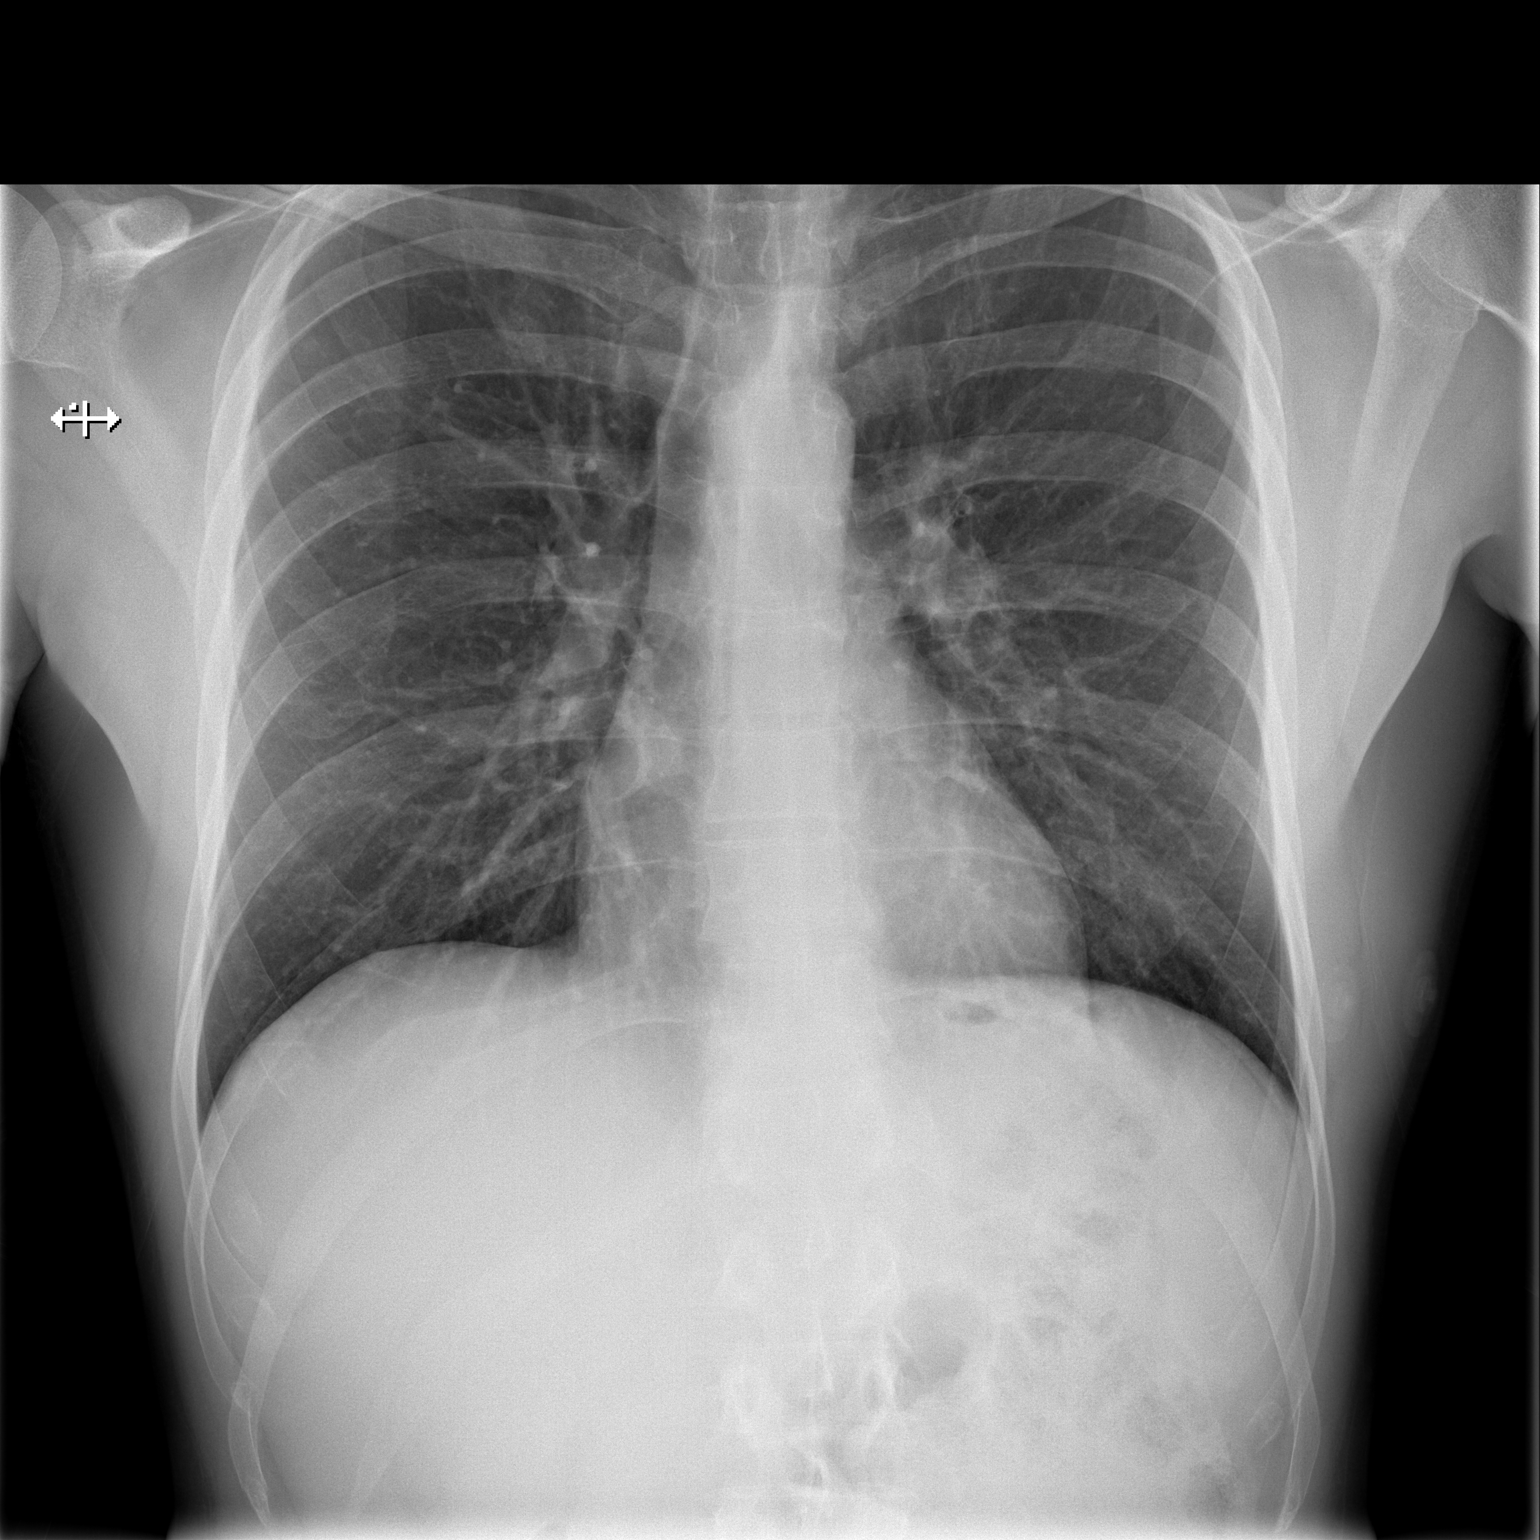

[2 of 2 positions shown; findings below may reference images not displayed]

FINDINGS: Cardiopericardial silhouette within normal limits.
Mediastinal contours normal. Trachea midline.  No airspace disease
or effusion.
IMPRESSION: No active cardiopulmonary disease.

## 2014-06-09 IMAGING — CT CT HEAD W/O CM
1 series · 16 of 30 positions shown, 20 images · non-contrast
Comparison: None.

CLINICAL DATA: Fall.  Head injury.  Headache

CT HEAD WITHOUT CONTRAST
TECHNIQUE: Contiguous axial images were obtained from the base of
the skull through the vertex without contrast

[Series 2: head 5.0 h30s · axial · 0.47mm/px · z∈[+1044,+1189]mm · 16 of 33 slices shown, 20 images]
[im 2/33  brain]
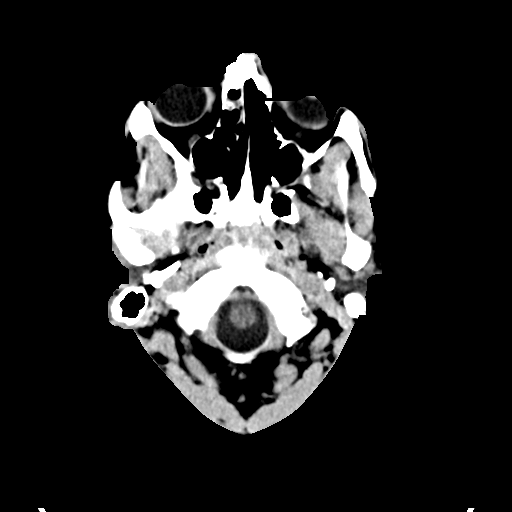
[im 2/33  bone]
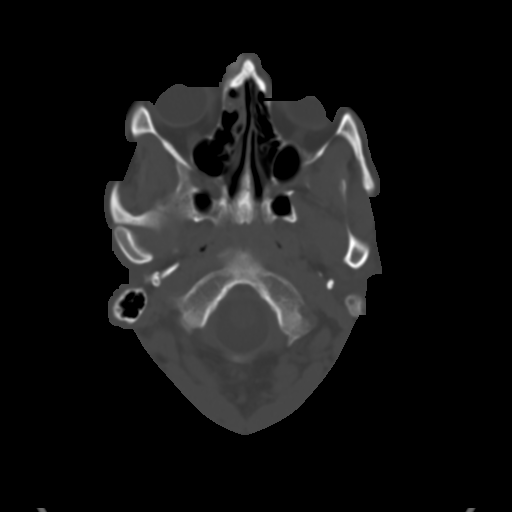
[im 4/33  brain]
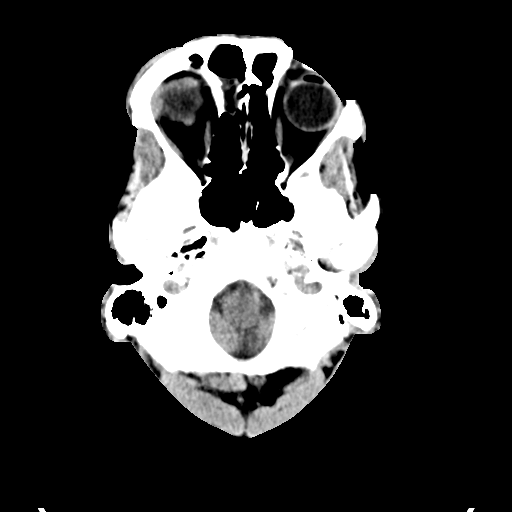
[im 6/33  brain]
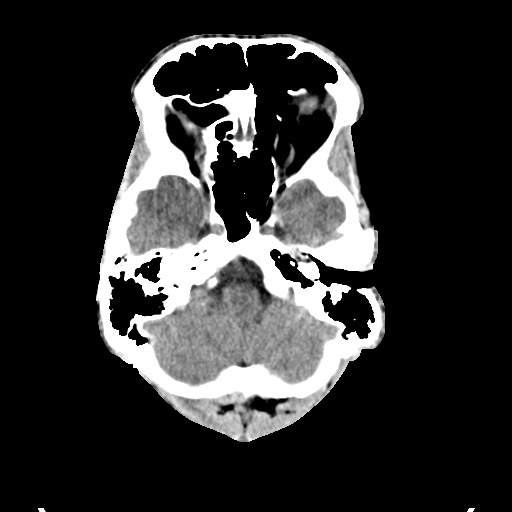
[im 8/33  brain]
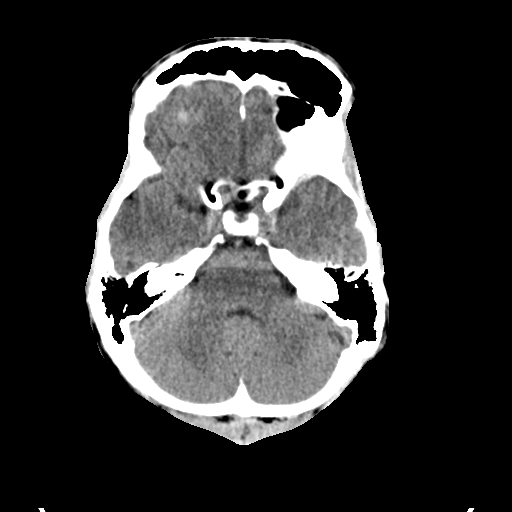
[im 9/33  brain]
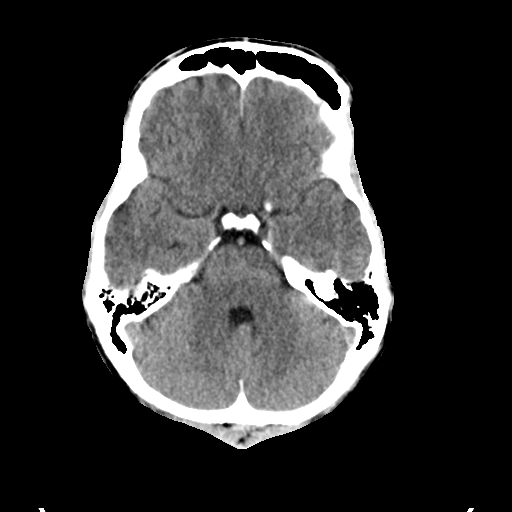
[im 9/33  bone]
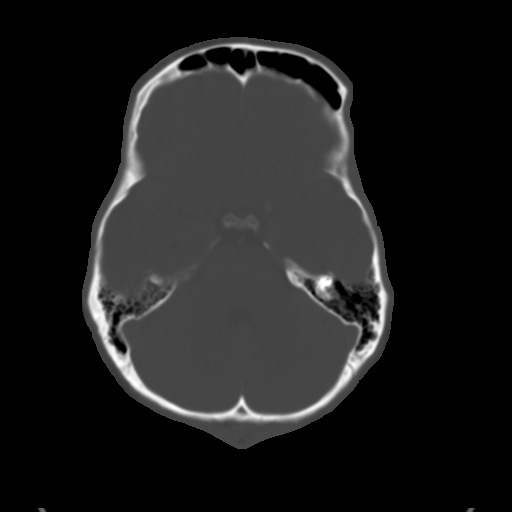
[im 12/33  brain]
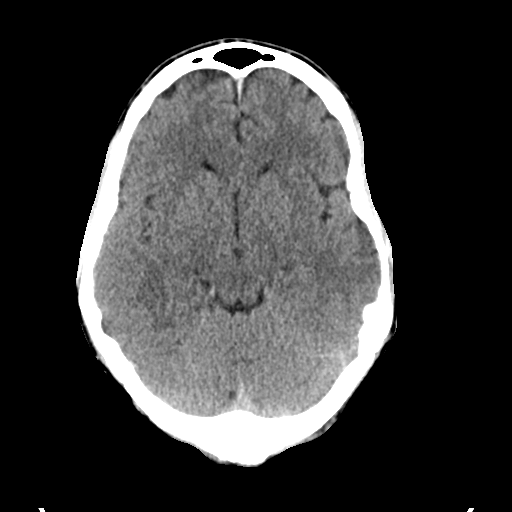
[im 14/33  brain]
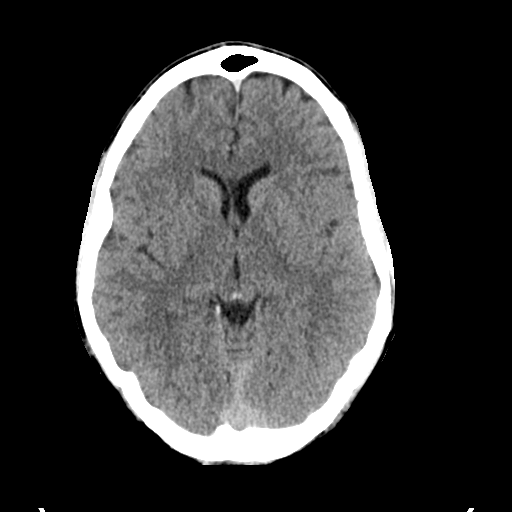
[im 16/33  brain]
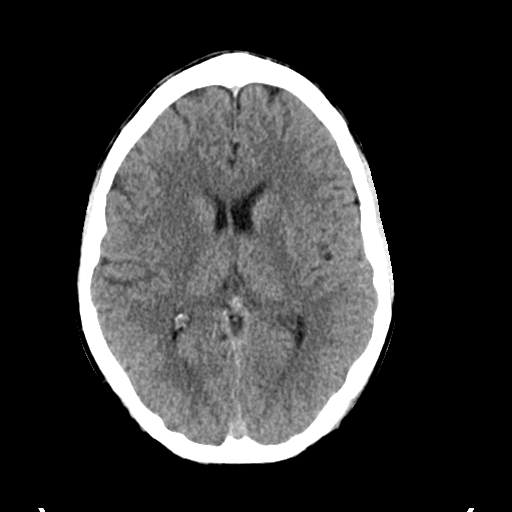
[im 17/33  brain]
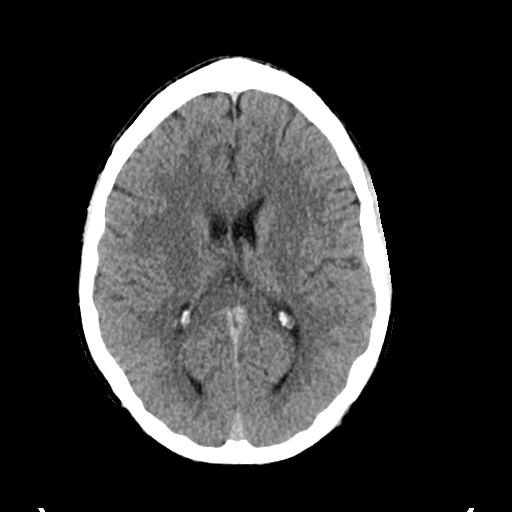
[im 17/33  bone]
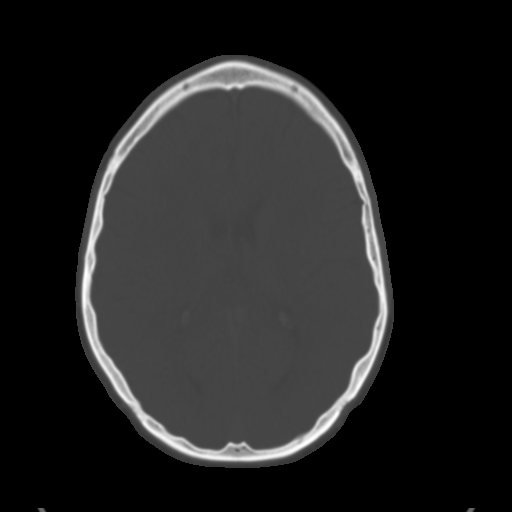
[im 19/33  brain]
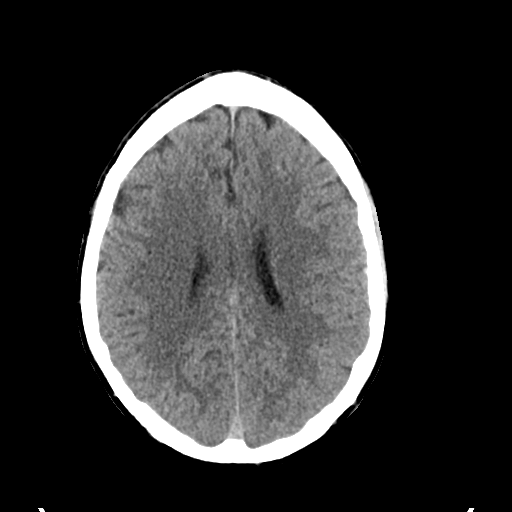
[im 21/33  brain]
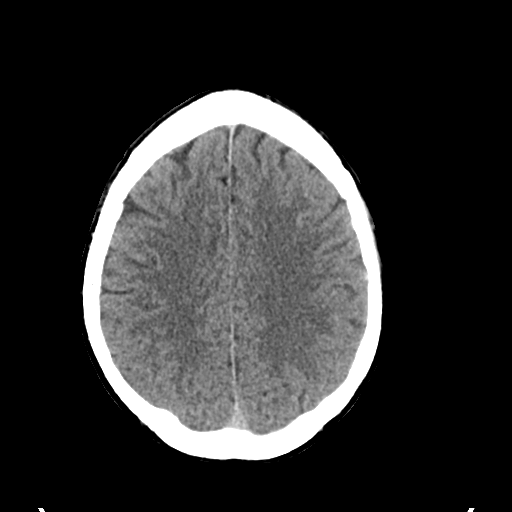
[im 24/33  brain]
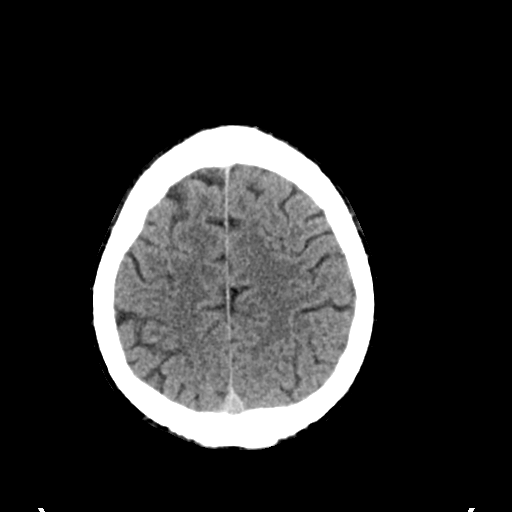
[im 25/33  brain]
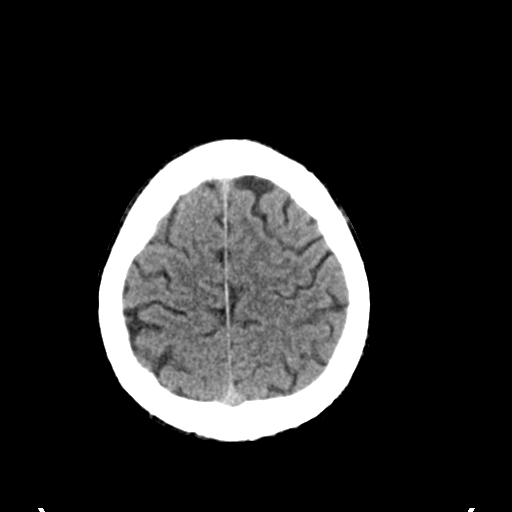
[im 25/33  bone]
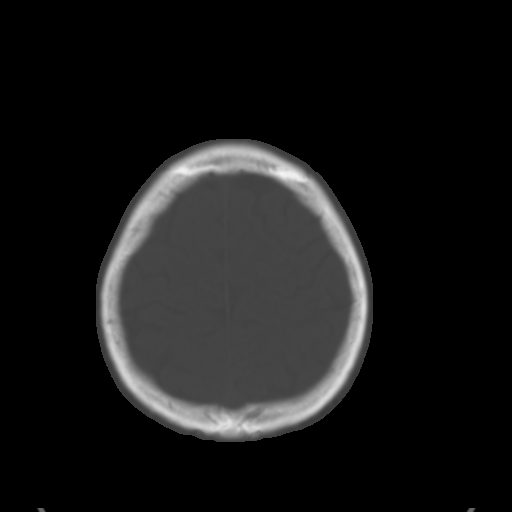
[im 27/33  brain]
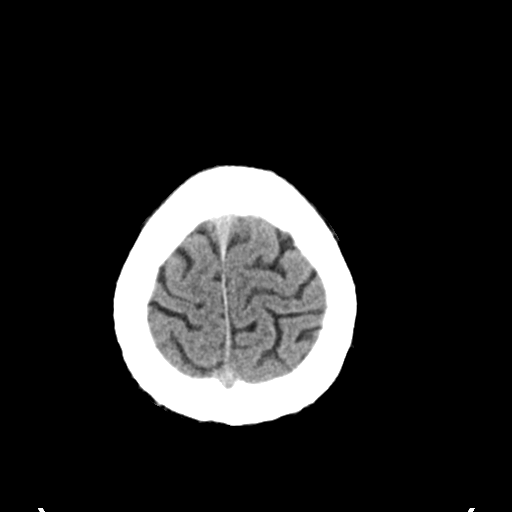
[im 29/33  brain]
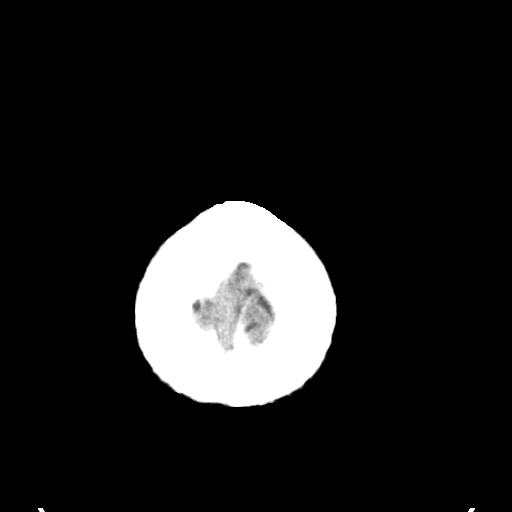
[im 31/33  brain]
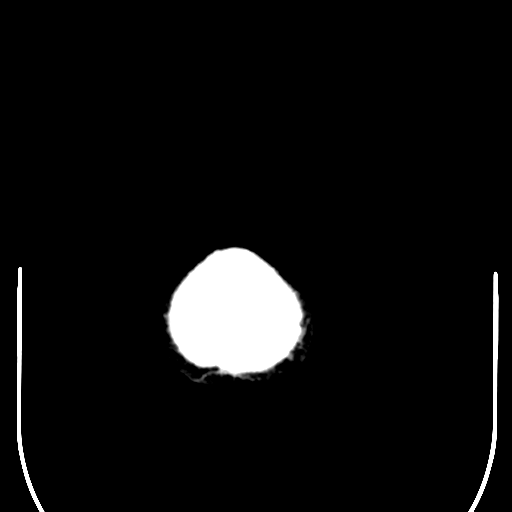

[16 of 30 positions shown; findings below may reference images not displayed]

FINDINGS: The brain has a normal appearance without evidence for
hemorrhage, acute infarction, hydrocephalus, or mass lesion.  There
is no extra axial fluid collection.  The skull and paranasal
sinuses are normal.
IMPRESSION: Normal CT of the head without contrast.

## 2015-02-12 ENCOUNTER — Ambulatory Visit (INDEPENDENT_AMBULATORY_CARE_PROVIDER_SITE_OTHER): Payer: 59 | Admitting: Family Medicine

## 2015-02-12 ENCOUNTER — Encounter: Payer: Self-pay | Admitting: Family Medicine

## 2015-02-12 VITALS — BP 150/88 | HR 85 | Temp 98.0°F | Ht 71.0 in | Wt 188.0 lb

## 2015-02-12 DIAGNOSIS — G8929 Other chronic pain: Secondary | ICD-10-CM

## 2015-02-12 DIAGNOSIS — B182 Chronic viral hepatitis C: Secondary | ICD-10-CM

## 2015-02-12 DIAGNOSIS — R51 Headache: Secondary | ICD-10-CM

## 2015-02-12 DIAGNOSIS — Z130 Encounter for screening for diseases of the blood and blood-forming organs and certain disorders involving the immune mechanism: Secondary | ICD-10-CM | POA: Diagnosis not present

## 2015-02-12 DIAGNOSIS — Z13220 Encounter for screening for lipoid disorders: Secondary | ICD-10-CM

## 2015-02-12 DIAGNOSIS — Z0000 Encounter for general adult medical examination without abnormal findings: Secondary | ICD-10-CM | POA: Diagnosis not present

## 2015-02-12 DIAGNOSIS — E663 Overweight: Secondary | ICD-10-CM

## 2015-02-12 DIAGNOSIS — R0602 Shortness of breath: Secondary | ICD-10-CM | POA: Diagnosis not present

## 2015-02-12 DIAGNOSIS — F1011 Alcohol abuse, in remission: Secondary | ICD-10-CM

## 2015-02-12 DIAGNOSIS — F1010 Alcohol abuse, uncomplicated: Secondary | ICD-10-CM

## 2015-02-12 DIAGNOSIS — F1910 Other psychoactive substance abuse, uncomplicated: Secondary | ICD-10-CM

## 2015-02-12 NOTE — Patient Instructions (Signed)
Please go an get your labs.  We will call with results.  Please let her know if your headache persists.  Follow-up in 3-6 months.  We will be in touch regarding a referral to infectious disease.  Take care  Dr. Adriana Simas

## 2015-02-12 NOTE — Progress Notes (Signed)
Pre visit review using our clinic review tool, if applicable. No additional management support is needed unless otherwise documented below in the visit note. 

## 2015-02-13 ENCOUNTER — Encounter: Payer: Self-pay | Admitting: Family Medicine

## 2015-02-13 DIAGNOSIS — Z0000 Encounter for general adult medical examination without abnormal findings: Secondary | ICD-10-CM | POA: Insufficient documentation

## 2015-02-13 DIAGNOSIS — R519 Headache, unspecified: Secondary | ICD-10-CM | POA: Insufficient documentation

## 2015-02-13 DIAGNOSIS — B182 Chronic viral hepatitis C: Secondary | ICD-10-CM | POA: Insufficient documentation

## 2015-02-13 DIAGNOSIS — F1011 Alcohol abuse, in remission: Secondary | ICD-10-CM | POA: Insufficient documentation

## 2015-02-13 DIAGNOSIS — R51 Headache: Secondary | ICD-10-CM

## 2015-02-13 DIAGNOSIS — R0602 Shortness of breath: Secondary | ICD-10-CM | POA: Insufficient documentation

## 2015-02-13 DIAGNOSIS — F1910 Other psychoactive substance abuse, uncomplicated: Secondary | ICD-10-CM | POA: Insufficient documentation

## 2015-02-13 DIAGNOSIS — F1921 Other psychoactive substance dependence, in remission: Secondary | ICD-10-CM | POA: Insufficient documentation

## 2015-02-13 NOTE — Assessment & Plan Note (Signed)
Chronic. Obtaining labs including genotype today. Referring to ID. Hopefully patient can get approved for Harvoni.

## 2015-02-13 NOTE — Assessment & Plan Note (Signed)
Noted on ROS. Normal lung exam today. May be from underlying COPD. Recommended Pulmicort function tests and patient elects to wait at this time.

## 2015-02-13 NOTE — Progress Notes (Signed)
Subjective:  Patient ID: Thomas Dorsey, male    DOB: 11-Dec-1975  Age: 40 y.o. MRN: [id number]  CC: Establish care  HPI Thomas Dorsey is a 40 y.o. male presents to the clinic today to establish care. Additional concerns are below.   Preventative Healthcare  Immunizations  Tetanus - UTD. 2014.  Pneumococcal - N/A.  Flu - Declines.  Hepatitis C screening - Has Hep C.  Labs: Screening labs today.   Exercise: Exercises regularly.   Alcohol use: Former alcoholic.   Smoking/tobacco use: Former smoker. Currently uses dip/snuff.  STD/HIV testing: Has had screening.  Regular dental exams: No.  Wears seat belt: Yes.   Hepatitis C  Known history of Hep C.  Has not received treatment.  Headache  Occurring regularly over the past 3 months.  Headache is located bitemporally.  Described as throbbing.  He reports associated vision changes (transient).   No associated photophobia or phonophobia.  No nausea or vomiting.  He's been using ibuprofen and Goody powder frequently.  Last for a few hours and resolves.  PMH, Surgical Hx, Family Hx, Social History reviewed and updated as below.  Past Medical History  Diagnosis Date  . ETOH abuse   . Polysubstance abuse   . Back pain    Past Surgical History  Procedure Laterality Date  . Hand surgery Right 2014   Family History  Problem Relation Age of Onset  . Hypertension Mother   . Heart disease Father   . Sudden death Father   . Colon cancer Maternal Aunt   . Ovarian cancer Paternal Grandmother   . Diabetes Paternal Grandfather    Social History  Substance Use Topics  . Smoking status: Former Smoker -- 1.00 packs/day  . Smokeless tobacco: Current User    Types: Snuff  . Alcohol Use: No     Comment: currently in recovery---- sober x 8 months.    Review of Systems  Respiratory: Positive for shortness of breath.   Neurological: Positive for dizziness and headaches.  All other systems  reviewed and are negative.  Objective:   Today's Vitals: BP 150/88 mmHg  Pulse 85  Temp(Src) 98 F (36.7 C) (Oral)  Ht _0  (1.803 m)  Wt 188 lb (85.276 kg)  BMI 26.23 kg/m2  SpO2 96%  Physical Exam  Constitutional: He is oriented to person, place, and time. He appears well-developed. No distress.  HENT:  Head: Normocephalic and atraumatic.  Mouth/Throat: Oropharynx is clear and moist.  Poor dentition.   Eyes: Conjunctivae are normal. No scleral icterus.  Neck: Neck supple.  Cardiovascular: Normal rate and regular rhythm.   Pulmonary/Chest: Effort normal and breath sounds normal. No respiratory distress. He has no wheezes. He has no rales.  Abdominal: Soft. He exhibits no distension.  Hepatomegaly - palpable 2 fingerbreaths below the costal margin.   Musculoskeletal: Normal range of motion. He exhibits no edema.  Lymphadenopathy:    He has no cervical adenopathy.  Neurological: He is alert and oriented to person, place, and time.  No focal deficits.  Skin: Skin is warm and dry. No erythema.  Psychiatric: He has a normal mood and affect.  Vitals reviewed.  Assessment & Plan:   Problem List Items Addressed This Visit    Preventative health care - Primary    Tdap up to date. Declines flu. Screening labs today.       Polysubstance abuse   History of alcohol abuse   SOB (shortness of breath)    Noted  on ROS. Normal lung exam today. May be from underlying COPD. Recommended Pulmicort function tests and patient elects to wait at this time.      Headache    Likely tension headache with a component of medication overuse. Advised him to cut back on NSAID use. Follow up if persists.       Hepatitis C    Chronic. Obtaining labs including genotype today. Referring to ID. Hopefully patient can get approved for Harvoni.       Relevant Orders   Hepatitis C Antibody   Hepatitis C Genotype   Hepatitis B core antibody, IgM   Hepatitis B core antibody, total    Hepatitis B surface antibody   HCV RNA quant   Hepatitis B Surface AntiGEN   Ambulatory referral to Infectious Disease    Other Visit Diagnoses    Screening, lipid        Relevant Orders    Lipid Profile    Overweight (BMI 25.0-29.9)        Relevant Orders    Comp Met (CMET)    HgB A1c    Screening for deficiency anemia        Relevant Orders    CBC       Outpatient Encounter Prescriptions as of 02/12/2015  Medication Sig  . [DISCONTINUED] esomeprazole (NEXIUM) 40 MG capsule Take 40 mg by mouth daily.  . [DISCONTINUED] naproxen (NAPROSYN) 500 MG tablet Take 500 mg by mouth 2 (two) times daily with a meal.  . [DISCONTINUED] traMADol (ULTRAM) 50 MG tablet Take 50 mg by mouth every 6 (six) hours as needed for pain.   No facility-administered encounter medications on file as of 02/12/2015.    Follow-up: 3-6 months.   [place]

## 2015-02-13 NOTE — Assessment & Plan Note (Signed)
Likely tension headache with a component of medication overuse. Advised him to cut back on NSAID use. Follow up if persists.

## 2015-02-13 NOTE — Assessment & Plan Note (Signed)
Tdap up to date. Declines flu. Screening labs today.  

## 2015-04-07 ENCOUNTER — Other Ambulatory Visit: Payer: 59

## 2015-04-07 DIAGNOSIS — B182 Chronic viral hepatitis C: Secondary | ICD-10-CM

## 2015-04-07 LAB — IRON: Iron: 85 ug/dL (ref 50–180)

## 2015-04-07 LAB — CBC WITH DIFFERENTIAL/PLATELET
Basophils Absolute: 0 cells/uL (ref 0–200)
Basophils Relative: 0 %
EOS PCT: 1 %
Eosinophils Absolute: 48 cells/uL (ref 15–500)
HCT: 39.9 % (ref 38.5–50.0)
Hemoglobin: 13.6 g/dL (ref 13.2–17.1)
LYMPHS ABS: 1008 {cells}/uL (ref 850–3900)
LYMPHS PCT: 21 %
MCH: 29.1 pg (ref 27.0–33.0)
MCHC: 34.1 g/dL (ref 32.0–36.0)
MCV: 85.4 fL (ref 80.0–100.0)
MPV: 9.9 fL (ref 7.5–12.5)
Monocytes Absolute: 288 cells/uL (ref 200–950)
Monocytes Relative: 6 %
NEUTROS PCT: 72 %
Neutro Abs: 3456 cells/uL (ref 1500–7800)
PLATELETS: 222 10*3/uL (ref 140–400)
RBC: 4.67 MIL/uL (ref 4.20–5.80)
RDW: 13 % (ref 11.0–15.0)
WBC: 4.8 10*3/uL (ref 3.8–10.8)

## 2015-04-07 LAB — COMPREHENSIVE METABOLIC PANEL
ALT: 84 U/L — ABNORMAL HIGH (ref 9–46)
AST: 40 U/L (ref 10–40)
Albumin: 4.5 g/dL (ref 3.6–5.1)
Alkaline Phosphatase: 83 U/L (ref 40–115)
BILIRUBIN TOTAL: 0.4 mg/dL (ref 0.2–1.2)
BUN: 18 mg/dL (ref 7–25)
CO2: 26 mmol/L (ref 20–31)
CREATININE: 0.92 mg/dL (ref 0.60–1.35)
Calcium: 9.1 mg/dL (ref 8.6–10.3)
Chloride: 105 mmol/L (ref 98–110)
GLUCOSE: 97 mg/dL (ref 65–99)
Potassium: 3.6 mmol/L (ref 3.5–5.3)
SODIUM: 140 mmol/L (ref 135–146)
Total Protein: 7.4 g/dL (ref 6.1–8.1)

## 2015-04-08 LAB — HEPATITIS B SURFACE ANTIBODY,QUALITATIVE: HEP B S AB: NEGATIVE

## 2015-04-08 LAB — PROTIME-INR
INR: 1.07 (ref <1.50)
PROTHROMBIN TIME: 14 s (ref 11.6–15.2)

## 2015-04-08 LAB — HEPATITIS B SURFACE ANTIGEN: Hepatitis B Surface Ag: NEGATIVE

## 2015-04-08 LAB — ANA: ANA: NEGATIVE

## 2015-04-08 LAB — HEPATITIS B CORE ANTIBODY, TOTAL: Hep B Core Total Ab: NONREACTIVE

## 2015-04-08 LAB — HEPATITIS A ANTIBODY, TOTAL: Hep A Total Ab: NONREACTIVE

## 2015-04-08 LAB — HIV ANTIBODY (ROUTINE TESTING W REFLEX): HIV 1&2 Ab, 4th Generation: NONREACTIVE

## 2015-04-11 LAB — HCV RNA, QUANT REAL-TIME PCR W/REFLEX
HCV RNA, PCR, QN (Log): 4.22 LogIU/mL — ABNORMAL HIGH
HCV RNA, PCR, QN: 16600 IU/mL — ABNORMAL HIGH

## 2015-04-11 LAB — HCV RNA,LIPA RFLX NS5A DRUG RESIST

## 2015-04-16 LAB — HCV RNA NS5A DRUG RESISTANCE: HCV NS5A Subtype: NOT DETECTED

## 2015-05-06 ENCOUNTER — Encounter: Payer: Self-pay | Admitting: Internal Medicine

## 2015-05-06 ENCOUNTER — Ambulatory Visit (INDEPENDENT_AMBULATORY_CARE_PROVIDER_SITE_OTHER): Payer: 59 | Admitting: Internal Medicine

## 2015-05-06 VITALS — BP 120/74 | HR 80 | Temp 98.0°F | Ht 71.0 in | Wt 183.0 lb

## 2015-05-06 DIAGNOSIS — B182 Chronic viral hepatitis C: Secondary | ICD-10-CM

## 2015-05-06 DIAGNOSIS — Z23 Encounter for immunization: Secondary | ICD-10-CM | POA: Diagnosis not present

## 2015-05-06 MED ORDER — LEDIPASVIR-SOFOSBUVIR 90-400 MG PO TABS: 1.0000 | ORAL_TABLET | Freq: Every day | ORAL | Status: DC

## 2015-05-06 NOTE — Patient Instructions (Signed)
Date 05/06/2015  Dear Mr. Lenise ArenaBreashears, As discussed in the ID Clinic, your hepatitis C therapy will include the following medications:          Harvoni 90mg /400mg  tablet:           Take 1 tablet by mouth once daily   Please note that ALL MEDICATIONS WILL START ON THE SAME DATE for a total of 12 weeks. ---------------------------------------------------------------- Your HCV Treatment Start Date: TBA   Your HCV genotype:  1a    Liver Fibrosis: TBD    ---------------------------------------------------------------- YOUR PHARMACY CONTACT:   [facility] [address] Phone: [phone] Hours: Monday to Friday 7:30 am to 6:00 pm   Please always contact your pharmacy at least 3-4 business days before you run out of medications to ensure your next month's medication is ready or 1 week prior to running out if you receive it by mail.  Remember, each prescription is for 28 days. ---------------------------------------------------------------- GENERAL NOTES REGARDING YOUR HEPATITIS C MEDICATION:  SOFOSBUVIR/LEDIPASVIR (HARVONI): - Harvoni tablet is taken daily with OR without food. - The tablets are orange. - The tablets should be stored at room temperature.  - Acid reducing agents such as H2 blockers (ie. Pepcid (famotidine), Zantac (ranitidine), Tagamet (cimetidine), Axid (nizatidine) and proton pump inhibitors (ie. Prilosec (omeprazole), Protonix (pantoprazole), Nexium (esomeprazole), or Aciphex (rabeprazole)) can decrease effectiveness of Harvoni. Do not take until you have discussed with a health care provider.    -Antacids that contain magnesium and/or aluminum hydroxide (ie. Milk of Magensia, Rolaids, Gaviscon, Maalox, Mylanta, an dArthritis Pain Formula)can reduce absorption of Harvoni, so take them at least 4 hours before or after Harvoni.  -Calcium carbonate (calcium supplements or antacids such as Tums,  Caltrate, Os-Cal)needs to be taken at least 4 hours hours before or after Harvoni.  -St. John's wort or any products that contain St. John's wort like some herbal supplements  Please inform the office prior to starting any of these medications.  - The common side effects associated with Harvoni include:      1. Fatigue      2. Headache      3. Nausea      4. Diarrhea      5. Insomnia  Please note that this only lists the most common side effects and is NOT a comprehensive list of the potential side effects of these medications. For more information, please review the drug information sheets that come with your medication package from the pharmacy.  ---------------------------------------------------------------- GENERAL HELPFUL HINTS ON HCV THERAPY: 1. Stay well-hydrated. 2. Notify the ID Clinic of any changes in your other over-the-counter/herbal or prescription medications. 3. If you miss a dose of your medication, take the missed dose as soon as you remember. Return to your regular time/dose schedule the next day.  4.  Do not stop taking your medications without first talking with your healthcare provider. 5.  You may take Tylenol (acetaminophen), as long as the dose is less than 2000 mg (OR no more than 4 tablets of the Tylenol Extra Strengths 500mg  tablet) in 24 hours. 6.  You will see our pharmacist-specialist within the first 2 weeks of starting your medication. 7.  You will need to obtain routine labs around week 4 and12 weeks after starting and then 3 to 6 months after finishing Harvoni.    Staci RighterOMER, Onesty Clair, MD  [facility] [address] [place],  [place]  [address] [phone]

## 2015-05-06 NOTE — Progress Notes (Signed)
[facility]   CC: consideration for treatment for chronic hepatitis C  HPI:  +Thomas Dorsey is a 40 y.o. male who presents for initial evaluation and management of chronic hepatitis C.  Patient tested positive 20 years ago. Hepatitis C-associated risk factors present are: IV drug abuse (details: over 1 year ago, remains clean). Patient denies renal dialysis, sexual contact with person with liver disease, tattoos. Patient has had other studies performed. Results: hepatitis C RNA by PCR, result: positive. Patient has not had prior treatment for Hepatitis C. Patient does not have a past history of liver disease. Patient does not have a family history of liver disease. Patient does not  have associated signs or symptoms related to liver disease.  Labs reviewed and confirm chronic hepatitis C with a positive viral load.   Records reviewed from PCP, new patient there but knew about hepatitis C from IV drug use in his 7420s.  Is committed to remaining clean, stopped smoking and drinking as well.       Patient does not have documented immunity to Hepatitis A. Patient does not have documented immunity to Hepatitis B.    Review of Systems:   Constitutional: negative for fatigue and malaise Hematologic/lymphatic: negative for lymphadenopathy Musculoskeletal: negative for myalgias and arthralgias All other systems reviewed and are negative      Past Medical History  Diagnosis Date  . ETOH abuse   . Polysubstance abuse   . Back pain     Prior to Admission medications   Medication Sig Start Date End Date Taking? Authorizing Provider  Ledipasvir-Sofosbuvir (HARVONI) 90-400 MG TABS Take 1 tablet by mouth daily. 05/06/15   Gardiner Barefootobert W Mazzy Santarelli, MD    No Known Allergies  Social History  Substance Use Topics  . Smoking status: Former Smoker -- 1.00 packs/day  . Smokeless tobacco: Current User    Types: Snuff  . Alcohol Use: No     Comment: currently in  recovery---- sober x 8 months.     Family History  Problem Relation Age of Onset  . Hypertension Mother   . Heart disease Father   . Sudden death Father   . Colon cancer Maternal Aunt   . Ovarian cancer Paternal Grandmother   . Diabetes Paternal Grandfather    No cirrhosis   Objective:  Constitutional: in no apparent distress and alert,  Filed Vitals:   05/06/15 1425  BP: 120/74  Pulse: 80  Temp: 98 F (36.7 C)   Eyes: anicteric Cardiovascular: Cor RRR and No murmurs Respiratory: CTA B; normal respiratory effort Gastrointestinal: Bowel sounds are normal, liver is not enlarged, spleen is not enlarged Musculoskeletal: no pedal edema noted Skin: negatives: no rash; no porphyria cutanea tarda Lymphatic: no cervical lymphadenopathy   Laboratory Genotype: No results found for: HCVGENOTYPE HCV viral load: No results found for: HCVQUANT Lab Results  Component Value Date   WBC 4.8 04/07/2015   HGB 13.6 04/07/2015   HCT 39.9 04/07/2015   MCV 85.4 04/07/2015   PLT 222 04/07/2015    Lab Results  Component Value Date   CREATININE 0.92 04/07/2015   BUN 18 04/07/2015   NA 140 04/07/2015   K 3.6 04/07/2015   CL 105 04/07/2015   CO2 26 04/07/2015    Lab Results  Component Value Date   ALT 84* 04/07/2015   AST 40 04/07/2015   ALKPHOS 83 04/07/2015     Labs and history reviewed and show CHILD-PUGH A  5-6 points:  Child class A 7-9 points: Child class B 10-15 points: Child class C  Lab Results  Component Value Date   INR 1.07 04/07/2015   BILITOT 0.4 04/07/2015   ALBUMIN 4.5 04/07/2015     Assessment: New Patient with Chronic Hepatitis C genotype 1a, untreated.  I discussed with the patient the lab findings that confirm chronic hepatitis C as well as the natural history and progression of disease including about 30% of people who develop cirrhosis of the liver if left untreated and once cirrhosis is established there is a 2-7% risk per year of liver cancer and  liver failure.  I discussed the importance of treatment and benefits in reducing the risk, even if significant liver fibrosis exists.   Plan: 1) Patient counseled extensively on limiting acetaminophen to no more than 2 grams daily, avoidance of alcohol. 2) Transmission discussed with patient including sexual transmission, sharing razors and toothbrush.   3) Will need referral to gastroenterology if concern for cirrhosis 4) Will need referral for substance abuse counseling: No.; Further work up to include urine drug screen  No. 5) Will prescribe Harvoni for 12 weeks 6) Hepatitis A vaccine Yes.   7) Hepatitis B vaccine Yes.   8) Pneumovax vaccine if concern for cirrhosis 9) Further work up to include liver staging with elastography 10) will follow up after starting medication

## 2015-05-07 NOTE — Addendum Note (Signed)
Addended by: Rejeana BrockMURRAY, CANDACE A on: 05/07/2015 10:36 AM   Modules accepted: Orders

## 2015-06-10 ENCOUNTER — Ambulatory Visit (HOSPITAL_COMMUNITY): Payer: 59

## 2015-06-11 ENCOUNTER — Ambulatory Visit: Payer: 59

## 2015-06-11 ENCOUNTER — Telehealth: Payer: Self-pay | Admitting: *Deleted

## 2015-06-11 NOTE — Telephone Encounter (Signed)
[facility] to inform Dr. Luciana Axeomer that patient no showed his ultrasound elastography appt. Phone # [phone]. Wendall MolaJacqueline Cockerham

## 2015-12-08 ENCOUNTER — Telehealth: Payer: Self-pay | Admitting: Family Medicine

## 2015-12-08 NOTE — Telephone Encounter (Signed)
pts wife called and stated that pt has been having a reoccurring rash on lower right abdomen for year but here in the last 3-4 months it has started to hurt and cause fever. Pt did not want to be seen by wife convinced him to come in to make sure he did not have shingles. She also stated pt has a knot on one of his pointer fingers she was unsure of right or left. This has been there for 22 month this was something that happened at work. Wife said it was hard has a golf ball and has not gone done. They were scheduled for 4 p.m on Thursday 12/10/15.

## 2015-12-10 ENCOUNTER — Encounter: Payer: Self-pay | Admitting: Family Medicine

## 2015-12-10 ENCOUNTER — Ambulatory Visit (INDEPENDENT_AMBULATORY_CARE_PROVIDER_SITE_OTHER): Payer: 59 | Admitting: Family Medicine

## 2015-12-10 DIAGNOSIS — M67441 Ganglion, right hand: Secondary | ICD-10-CM | POA: Insufficient documentation

## 2015-12-10 DIAGNOSIS — R21 Rash and other nonspecific skin eruption: Secondary | ICD-10-CM | POA: Diagnosis not present

## 2015-12-10 MED ORDER — TRIAMCINOLONE ACETONIDE 0.5 % EX OINT: 1.0000 "application " | TOPICAL_OINTMENT | Freq: Two times a day (BID) | CUTANEOUS | 0 refills | Status: AC

## 2015-12-10 NOTE — Assessment & Plan Note (Signed)
New problem. Suspect contact dermatitis.  Treating with topical steroid. If no improvement will send to derm.

## 2015-12-10 NOTE — Progress Notes (Signed)
Pre visit review using our clinic review tool, if applicable. No additional management support is needed unless otherwise documented below in the visit note. 

## 2015-12-10 NOTE — Assessment & Plan Note (Signed)
New problem. Nontender.  Patient declines intervention at this time.

## 2015-12-10 NOTE — Patient Instructions (Signed)
Let me know if it does not improve and we will send to derm.  Take care  Dr. Adriana Simasook

## 2015-12-10 NOTE — Progress Notes (Signed)
Subjective:  Patient ID: Cleon DewChristopher J Foglio, male    DOB: 10/02/1975  Age: 40 y.o. MRN: [id number]  CC: Rash, ? Cyst on Finger  HPI:  40 year old male presents with the above complaints.  Rash  Started 2 years ago. Resolved spontaneously.  Has recurred over the past 4 months.  Located on the lower abdomen at the belt line.  Very pruritic.  No new exposures, changes.  He has tried several OTC topical agents with no improvement.   No reported exacerbating factors.   ? Cyst  Patient has noted a hard nodule on his right index finger over the past 4 months.  Located on the dorsum of the finger just above the PIP.  No reported fall, trauma, injury.  Nontender.  No redness or drainage.  No known exacerbating or relieving factors.   Social Hx   Social History   Social History  . Marital status: Single    Spouse name: N/A  . Number of children: N/A  . Years of education: N/A   Social History Main Topics  . Smoking status: Former Smoker    Packs/day: 1.00  . Smokeless tobacco: Current User    Types: Snuff  . 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   . Drug use:      Comment: History of polysubstance abuse.   [name]. Sexual activity: Yes   Other Topics Concern  . None   Social History Narrative  . None    Review of Systems  Musculoskeletal:       ? Cyst, right index finger.   Skin: Positive for rash.    Objective:  BP 120/71 (BP Location: Left Arm, Patient Position: Sitting, Cuff Size: Normal)   Pulse 76   Temp 98.1 F (36.7 C) (Oral)   Resp 14   Wt 191 lb 6 oz (86.8 kg)   SpO2 98%   BMI 26.69 kg/m   BP/Weight 12/10/2015 05/06/2015 02/12/2015  Systolic BP 120 120 150  Diastolic BP 71 74 88  Wt. (Lbs) 191.38 183 188  BMI 26.69 25.53 26.23    Physical Exam  Constitutional: He is oriented to person, place, and time. He appears well-developed. No distress.  Musculoskeletal:  Right index finger - firm nodule  noted on the dorsum just above the PIP joint.  Neurological: He is alert and oriented to person, place, and time.  Skin:  Lower abdomen with an erythematous dry papular rash. Evidence of excoriation noted as well.   Psychiatric: He has a normal mood and affect.  Vitals reviewed.  Lab Results  Component Value Date   WBC 4.8 04/07/2015   HGB 13.6 04/07/2015   HCT 39.9 04/07/2015   PLT 222 04/07/2015   GLUCOSE 97 04/07/2015   ALT 84 (H) 04/07/2015   AST 40 04/07/2015   NA 140 04/07/2015   K 3.6 04/07/2015   CL 105 04/07/2015   CREATININE 0.92 04/07/2015   BUN 18 04/07/2015   CO2 26 04/07/2015   TSH 5.17 (H) 06/21/2012   INR 1.07 04/07/2015    Assessment & Plan:   Problem List Items Addressed This Visit    Rash    New problem. Suspect contact dermatitis.  Treating with topical steroid. If no improvement will send to derm.       Ganglion cyst of finger of right hand    New problem. Nontender.  Patient declines intervention at this time.  Meds ordered this encounter  Medications  . triamcinolone ointment (KENALOG) 0.5 %    Sig: Apply 1 application topically 2 (two) times daily.    Dispense:  30 g    Refill:  0    Follow-up: PRN  Everlene OtherJayce Jonny Dearden DO [facility] [place]

## 2016-05-27 ENCOUNTER — Telehealth: Payer: Self-pay | Admitting: *Deleted

## 2016-05-27 NOTE — Telephone Encounter (Signed)
Patient currently has a boil on his elbow, pt has a hx of boils. Pt requested to know if it would be okay to wait until next week for a available appt with Dr.Cook to have this drained, or see someone today Pt contact [phone]

## 2016-05-27 NOTE — Telephone Encounter (Signed)
Attempted to reach patient to discuss further.  Left a VM.

## 2016-05-31 NOTE — Telephone Encounter (Signed)
Attempted to reach again, left VM.

## 2016-10-17 ENCOUNTER — Ambulatory Visit: Payer: 59 | Admitting: Family

## 2019-01-23 ENCOUNTER — Encounter: Payer: Self-pay | Admitting: Emergency Medicine

## 2019-01-23 ENCOUNTER — Other Ambulatory Visit: Payer: Self-pay

## 2019-01-23 ENCOUNTER — Emergency Department: Payer: Self-pay

## 2019-01-23 ENCOUNTER — Emergency Department
Admission: EM | Admit: 2019-01-23 | Discharge: 2019-01-23 | Disposition: A | Discharge status: Home / Self Care | Payer: Self-pay | Attending: Emergency Medicine | Admitting: Emergency Medicine

## 2019-01-23 DIAGNOSIS — W230XXA Caught, crushed, jammed, or pinched between moving objects, initial encounter: Secondary | ICD-10-CM | POA: Insufficient documentation

## 2019-01-23 DIAGNOSIS — Z23 Encounter for immunization: Secondary | ICD-10-CM | POA: Insufficient documentation

## 2019-01-23 DIAGNOSIS — Y929 Unspecified place or not applicable: Secondary | ICD-10-CM | POA: Insufficient documentation

## 2019-01-23 DIAGNOSIS — Y999 Unspecified external cause status: Secondary | ICD-10-CM | POA: Insufficient documentation

## 2019-01-23 DIAGNOSIS — F17200 Nicotine dependence, unspecified, uncomplicated: Secondary | ICD-10-CM | POA: Insufficient documentation

## 2019-01-23 DIAGNOSIS — Y939 Activity, unspecified: Secondary | ICD-10-CM | POA: Insufficient documentation

## 2019-01-23 DIAGNOSIS — S61411A Laceration without foreign body of right hand, initial encounter: Secondary | ICD-10-CM | POA: Insufficient documentation

## 2019-01-23 MED ORDER — TETANUS-DIPHTH-ACELL PERTUSSIS 5-2.5-18.5 LF-MCG/0.5 IM SUSP
0.5000 mL | Freq: Once | INTRAMUSCULAR | Status: AC
  Administered 2019-01-23: 0.5 mL via INTRAMUSCULAR
  Filled 2019-01-23: qty 0.5

## 2019-01-23 MED ORDER — BACITRACIN-NEOMYCIN-POLYMYXIN OINTMENT TUBE
1.0000 "application " | TOPICAL_OINTMENT | Freq: Once | CUTANEOUS | Status: AC
  Administered 2019-01-23: 1 via TOPICAL
  Filled 2019-01-23: qty 14.17

## 2019-01-23 MED ORDER — OXYCODONE-ACETAMINOPHEN 5-325 MG PO TABS: 1.0000 | ORAL_TABLET | Freq: Three times a day (TID) | ORAL | 0 refills | Status: DC | PRN

## 2019-01-23 MED ORDER — OXYCODONE-ACETAMINOPHEN 5-325 MG PO TABS
2.0000 | ORAL_TABLET | Freq: Once | ORAL | Status: AC
  Administered 2019-01-23: 2 via ORAL
  Filled 2019-01-23: qty 2

## 2019-01-23 MED ORDER — LIDOCAINE HCL (PF) 1 % IJ SOLN
INTRAMUSCULAR | Status: AC
  Filled 2019-01-23: qty 5

## 2019-01-23 MED ORDER — LIDOCAINE-EPINEPHRINE 2 %-1:100000 IJ SOLN
10.0000 mL | Freq: Once | INTRAMUSCULAR | Status: AC
  Administered 2019-01-23: 10 mL via INTRADERMAL
  Filled 2019-01-23: qty 1

## 2019-01-23 MED ORDER — CEPHALEXIN 500 MG PO CAPS: 500.0000 mg | ORAL_CAPSULE | Freq: Four times a day (QID) | ORAL | 0 refills | Status: AC

## 2019-01-23 MED ORDER — MUPIROCIN 2 % EX OINT: TOPICAL_OINTMENT | CUTANEOUS | 0 refills | Status: AC

## 2019-01-23 NOTE — ED Notes (Signed)
ED Provider Willaims at bedside. Pt confirms rife home.

## 2019-01-23 NOTE — ED Provider Notes (Signed)
[facility] Emergency Department Provider Note       Time seen: ----------------------------------------- 5:54 PM on 01/23/2019 -----------------------------------------   I have reviewed the triage vital signs and the nursing notes.  HISTORY   Chief Complaint Laceration   HPI Thomas Dorsey is a 44 y.o. male with a history of back pain, alcohol abuse, substance abuse who presents to the ED for a right hand injury.  Patient states he was working on a fence and got caught in the gait.  He sustained a deep laceration over the medial aspect of his right hand.  He does not member his last tetanus shot.  He has full range of motion of the hand.  Past Medical History:  Diagnosis Date  . Back pain   . ETOH abuse   . Polysubstance abuse [facility])     Patient Active Problem List   Diagnosis Date Noted  . Rash 12/10/2015  . Ganglion cyst of finger of right hand 12/10/2015  . Preventative health care 02/13/2015  . Polysubstance abuse ([place]) 02/13/2015  . History of alcohol abuse 02/13/2015  . Chronic hepatitis C without hepatic coma ([place]) 02/13/2015    Past Surgical History:  Procedure Laterality Date  . HAND SURGERY Right 2014    Allergies Patient has no known allergies.  Social History Social History   Tobacco Use  . Smoking status: Former Smoker    Packs/day: 1.00  . Smokeless tobacco: Current User    Types: Snuff  Substance Use Topics  . 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   . Drug use: Yes    Comment: History of polysubstance abuse.    Review of Systems Constitutional: Negative for fever. Musculoskeletal: Positive for mild right hand pain Skin: Positive for right hand laceration Neurological: Negative for headaches, focal weakness or numbness.  All systems negative/normal/unremarkable except as stated in the  HPI  ____________________________________________   PHYSICAL EXAM:  VITAL SIGNS: ED Triage Vitals [01/23/19 1753]  Enc Vitals Group     BP      Pulse      Resp      Temp      Temp src      SpO2      Weight 203 lb (92.1 kg)     Height 5\' 11"  (1.803 m)     Head Circumference      Peak Flow      Pain Score 2     Pain Loc      Pain Edu?      Excl. in [place]?    Constitutional: Alert and oriented. Well appearing and in no distress. Eyes: Conjunctivae are normal. Normal extraocular movements. Musculoskeletal: Nontender with normal range of motion in extremities including the right hand.  There is a deep V-shaped laceration over the medial aspect of his right hand and anatomical location.  He has normal flexion and extension of the fifth digit with no sensory deficits.  Mild bleeding is noted. Neurologic:  Normal speech and language. No gross focal neurologic deficits are appreciated.  Skin: Large medial right hand laceration Psychiatric: Mood and affect are normal. Speech and behavior are normal.  ____________________________________________  ED COURSE:  As part of my medical decision making, I reviewed the following data within the [place] History obtained from family if available, nursing notes, old chart and ekg, as well as notes from prior ED visits. Patient  presented for a right hand laceration, we will assess with labs and imaging as indicated at this time.   [name].Laceration Repair  Date/Time: 01/23/2019 7:05 PM Performed by: Emily Filbert, MD Authorized by: Emily Filbert, MD   Consent:    Consent obtained:  Verbal   Consent given by:  Patient Anesthesia (see MAR for exact dosages):    Anesthesia method:  Local infiltration   Local anesthetic:  Lidocaine 1% WITH epi Laceration details:    Location:  Hand   Length (cm):  12   Depth (mm):  10 Repair type:    Repair type:  Complex Pre-procedure details:    Preparation:  Patient was  prepped and draped in usual sterile fashion and imaging obtained to evaluate for foreign bodies Exploration:    Limited defect created (wound extended): no     Wound exploration: wound explored through full range of motion     Wound extent: areolar tissue violated and fascia violated     Contaminated: no   Treatment:    Area cleansed with:  Betadine   Amount of cleaning:  Extensive   Irrigation solution:  Sterile saline   Visualized foreign bodies/material removed: no     Debridement:  Minimal   Undermining:  Minimal   Scar revision: no   Skin repair:    Repair method:  Sutures   Suture size:  4-0   Suture material:  Prolene   Number of sutures:  20 Approximation:    Approximation:  Close Post-procedure details:    Dressing:  Adhesive bandage   Patient tolerance of procedure:  Tolerated well, no immediate complications    [name] was evaluated in Emergency Department on 01/23/2019 for the symptoms described in the history of present illness. He was evaluated in the context of the global COVID-19 pandemic, which necessitated consideration that the patient might be at risk for infection with the SARS-CoV-2 virus that causes COVID-19. Institutional protocols and algorithms that pertain to the evaluation of patients at risk for COVID-19 are in a state of rapid change based on information released by regulatory bodies including the CDC and federal and state organizations. These policies and algorithms were followed during the patient's care in the ED.  ____________________________________________   RADIOLOGY Images were viewed by me  Right hand x-ray Reveals soft tissue injury but no bony abnormality ____________________________________________   DIFFERENTIAL DIAGNOSIS   Laceration, contusion, fracture  FINAL ASSESSMENT AND PLAN  Complex right hand laceration   Plan: The patient had presented for right hand laceration.  Patient's imaging did not reveal any  fracture.  Wound was repaired as dictated above.  This was a complex deep laceration without any tendon injury, obvious nerve damage or motor deficit.  He is right-hand dominant and appears to have normal hand function.  Wound was closed with good cosmetic result.  He is advised to return in 2 days for wound check.  He will receive oral and topical antibiotics.   Ulice Dash, MD    Note: This note was generated in part or whole with voice recognition software. Voice recognition is usually quite accurate but there are transcription errors that can and very often do occur. I apologize for any typographical errors that were not detected and corrected.     Emily Filbert, MD 01/23/19 Nicholos Johns

## 2019-01-23 NOTE — ED Triage Notes (Signed)
Pt in via POV, reports working on a fence, getting hand caught with V shaped laceration to left hand.  Laceration appears to be deep; full ROM intact at this time.

## 2020-08-03 ENCOUNTER — Emergency Department: Payer: Self-pay

## 2020-08-03 ENCOUNTER — Other Ambulatory Visit: Payer: Self-pay

## 2020-08-03 ENCOUNTER — Emergency Department
Admission: EM | Admit: 2020-08-03 | Discharge: 2020-08-03 | Disposition: A | Discharge status: Home / Self Care | Payer: Self-pay | Attending: Emergency Medicine | Admitting: Emergency Medicine

## 2020-08-03 DIAGNOSIS — J189 Pneumonia, unspecified organism: Secondary | ICD-10-CM

## 2020-08-03 DIAGNOSIS — Z20822 Contact with and (suspected) exposure to covid-19: Secondary | ICD-10-CM | POA: Insufficient documentation

## 2020-08-03 DIAGNOSIS — Z79899 Other long term (current) drug therapy: Secondary | ICD-10-CM | POA: Insufficient documentation

## 2020-08-03 DIAGNOSIS — J181 Lobar pneumonia, unspecified organism: Secondary | ICD-10-CM | POA: Insufficient documentation

## 2020-08-03 LAB — CBC
HCT: 36.6 % — ABNORMAL LOW (ref 39.0–52.0)
Hemoglobin: 12 g/dL — ABNORMAL LOW (ref 13.0–17.0)
MCH: 28.9 pg (ref 26.0–34.0)
MCHC: 32.8 g/dL (ref 30.0–36.0)
MCV: 88.2 fL (ref 80.0–100.0)
Platelets: 382 10*3/uL (ref 150–400)
RBC: 4.15 MIL/uL — ABNORMAL LOW (ref 4.22–5.81)
RDW: 12.1 % (ref 11.5–15.5)
WBC: 12.7 10*3/uL — ABNORMAL HIGH (ref 4.0–10.5)
nRBC: 0 % (ref 0.0–0.2)

## 2020-08-03 LAB — TROPONIN I (HIGH SENSITIVITY): Troponin I (High Sensitivity): 6 ng/L (ref <18)

## 2020-08-03 LAB — BASIC METABOLIC PANEL
Anion gap: 10 (ref 5–15)
BUN: 14 mg/dL (ref 6–20)
CO2: 26 mmol/L (ref 22–32)
Calcium: 8.2 mg/dL — ABNORMAL LOW (ref 8.9–10.3)
Chloride: 102 mmol/L (ref 98–111)
Creatinine, Ser: 0.76 mg/dL (ref 0.61–1.24)
GFR, Estimated: >60 mL/min (ref >60)
Glucose, Bld: 93 mg/dL (ref 70–99)
Potassium: 3.7 mmol/L (ref 3.5–5.1)
Sodium: 138 mmol/L (ref 135–145)

## 2020-08-03 LAB — RESP PANEL BY RT-PCR (FLU A&B, COVID) ARPGX2
Influenza A by PCR: NEGATIVE
Influenza B by PCR: NEGATIVE
SARS Coronavirus 2 by RT PCR: NEGATIVE

## 2020-08-03 MED ORDER — AMOXICILLIN 500 MG PO CAPS: 1000.0000 mg | ORAL_CAPSULE | Freq: Three times a day (TID) | ORAL | 0 refills | Status: AC

## 2020-08-03 NOTE — ED Notes (Signed)
Pt ambulatory to triage desk with c/o generalized chest discomfort and cough for several days.  Pt states "feels like pneumonia".  NAD noted at this time.

## 2020-08-03 NOTE — ED Triage Notes (Signed)
Pt here with CP that started Sat that is intermittent but got worse last night. Pt also was running a l;ow fever since Sat. Pt is on the right side and does not radiate. Pt describes it as a stabbing pain. Pt also states that he has been coughing up mucus.

## 2020-08-03 NOTE — ED Provider Notes (Signed)
[facility] Emergency Department Provider Note   ____________________________________________   Event Date/Time   First MD Initiated Contact with Patient 08/03/20 [phone]     (approximate)  I have reviewed the triage vital signs and the nursing notes.   HISTORY  Chief Complaint Chest Pain    HPI Thomas Dorsey is a 45 y.o. male with past medical history of polysubstance abuse and hepatitis C who presents to the ED complaining of chest pain.  Patient reports that he has had 1 week of gradually worsening pain in the right side of his chest.  He describes it as sharp and worse when he takes a deep breath or coughs.  He states he has had a cough productive of brownish sputum for the past few days and has felt feverish, but has not checked his temperature at home.  He denies any shortness of breath, nausea, or vomiting but does endorse some diarrhea.  He has not had any sick contacts and no one has been sick at home.  He denies any personal cardiac history, but states his father passed away from an MI in his 76s.        Past Medical History:  Diagnosis Date   Back pain    ETOH abuse    Polysubstance abuse [facility])     Patient Active Problem List   Diagnosis Date Noted   Rash 12/10/2015   Ganglion cyst of finger of right hand 12/10/2015   Preventative health care 02/13/2015   Polysubstance abuse ([facility]) 02/13/2015   History of alcohol abuse 02/13/2015   Chronic hepatitis C without hepatic coma ([facility]) 02/13/2015    Past Surgical History:  Procedure Laterality Date   HAND SURGERY Right 2014    Prior to Admission medications   Medication Sig Start Date End Date Taking? Authorizing Provider  amoxicillin (AMOXIL) 500 MG capsule Take 2 capsules (1,000 mg total) by mouth 3 (three) times daily for 7 days. 08/03/20 08/10/20 Yes Chesley Noon, MD  Ledipasvir-Sofosbuvir (HARVONI) 90-400 MG TABS Take 1 tablet by mouth daily. Patient not taking: Reported on  12/10/2015 05/06/15   Gardiner Barefoot, MD  oxyCODONE-acetaminophen (PERCOCET) 5-325 MG tablet Take 1 tablet by mouth every 8 (eight) hours as needed. 01/23/19   Emily Filbert, MD  triamcinolone ointment (KENALOG) 0.5 % Apply 1 application topically 2 (two) times daily. 12/10/15   Tommie Sams, DO    Allergies Patient has no known allergies.  Family History  Problem Relation Age of Onset   Hypertension Mother    Heart disease Father    Sudden death Father    Colon cancer Maternal Aunt    Ovarian cancer Paternal Grandmother    Diabetes Paternal Grandfather     Social History Social History   Tobacco Use   Smoking status: Former    Packs/day: 1.00    Types: Cigarettes   Smokeless tobacco: Current    Types: Snuff  Vaping Use   Vaping Use: Every day  Substance Use Topics   Alcohol use: Not Currently    Alcohol/week: 0.0 standard drinks    Comment: Hx ETOH abuse   Drug use: Not Currently    Comment: History of polysubstance abuse.     Review of Systems  Constitutional: Positive for subjective fever/chills Eyes: No visual changes. ENT: No sore throat. Cardiovascular: Positive for chest pain. Respiratory: Denies shortness of breath.  Positive for cough. Gastrointestinal: No abdominal pain.  No nausea, no vomiting.  Positive for diarrhea.  No constipation. Genitourinary: Negative for dysuria. Musculoskeletal: Negative for back pain. Skin: Negative for rash. Neurological: Negative for headaches, focal weakness or numbness.  ____________________________________________   PHYSICAL EXAM:  VITAL SIGNS: ED Triage Vitals  Enc Vitals Group     BP 08/03/20 0915 125/84     Pulse Rate 08/03/20 0915 80     Resp 08/03/20 0915 18     Temp 08/03/20 0915 98.1 F (36.7 C)     Temp Source 08/03/20 0915 Oral     SpO2 08/03/20 0915 99 %     Weight 08/03/20 0916 210 lb (95.3 kg)     Height 08/03/20 0916 5\' 11"  (1.803 m)     Head Circumference --      Peak Flow --      Pain  Score 08/03/20 0916 7     Pain Loc --      Pain Edu? --      Excl. in [place]? --     Constitutional: Alert and oriented. Eyes: Conjunctivae are normal. Head: Atraumatic. Nose: No congestion/rhinnorhea. Mouth/Throat: Mucous membranes are moist. Neck: Normal ROM Cardiovascular: Normal rate, regular rhythm. Grossly normal heart sounds.  2+ radial pulses bilaterally. Respiratory: Normal respiratory effort.  No retractions. Lungs CTAB.  No chest wall tenderness to palpation. Gastrointestinal: Soft and nontender. No distention. Genitourinary: deferred Musculoskeletal: No lower extremity tenderness nor edema. Neurologic:  Normal speech and language. No gross focal neurologic deficits are appreciated. Skin:  Skin is warm, dry and intact. No rash noted. Psychiatric: Mood and affect are normal. Speech and behavior are normal.  ____________________________________________   LABS (all labs ordered are listed, but only abnormal results are displayed)  Labs Reviewed  BASIC METABOLIC PANEL - Abnormal; Notable for the following components:      Result Value   Calcium 8.2 (*)    All other components within normal limits  CBC - Abnormal; Notable for the following components:   WBC 12.7 (*)    RBC 4.15 (*)    Hemoglobin 12.0 (*)    HCT 36.6 (*)    All other components within normal limits  RESP PANEL BY RT-PCR (FLU A&B, COVID) ARPGX2  TROPONIN I (HIGH SENSITIVITY)   ____________________________________________  EKG  ED ECG REPORT I, 10/03/20, the attending physician, personally viewed and interpreted this ECG.   Date: 08/03/2020  EKG Time: 9:21  Rate: 74  Rhythm: normal sinus rhythm  Axis: Normal  Intervals:none  ST&T Change: None   PROCEDURES  Procedure(s) performed (including Critical Care):  Procedures   ____________________________________________   INITIAL IMPRESSION / ASSESSMENT AND PLAN / ED COURSE      45 year old male with past medical history of  polysubstance abuse and hepatitis C who presents to the ED complaining of sharp pain in the right side of his chest worse with a deep breath or when he coughs for about the past week.  He has had subjective fevers along with a productive cough and I am most suspicious for infectious etiology.  Chest x-ray reviewed by me and shows possible infiltrate in the right middle lobe, radiology read is pending.  Vital signs are reassuring and I have low suspicion for PE, patient is PERC negative.  EKG shows no evidence of arrhythmia or ischemia, we will check troponin but overall low suspicion for ACS given his atypical symptoms.  Troponin is negative and I doubt ACS given his constant chest pain for multiple days, do not feel repeat troponin is indicated at this time.  Additional  labs are unremarkable, patient continues to maintain O2 sats on room air without difficulty breathing.  He is appropriate for outpatient management of community-acquired pneumonia, we will treat with amoxicillin.  He was counseled to follow-up with his PCP and to return to the ED for new worsening symptoms, patient agrees with plan.      ____________________________________________   FINAL CLINICAL IMPRESSION(S) / ED DIAGNOSES  Final diagnoses:  Community acquired pneumonia of right upper lobe of lung     ED Discharge Orders          Ordered    amoxicillin (AMOXIL) 500 MG capsule  3 times daily        08/03/20 1146             Note:  This document was prepared using Dragon voice recognition software and may include unintentional dictation errors.    Chesley Noon, MD 08/03/20 1147

## 2021-08-19 ENCOUNTER — Emergency Department: Payer: PRIVATE HEALTH INSURANCE

## 2021-08-19 ENCOUNTER — Encounter: Payer: Self-pay | Admitting: Emergency Medicine

## 2021-08-19 ENCOUNTER — Other Ambulatory Visit: Payer: Self-pay

## 2021-08-19 ENCOUNTER — Emergency Department
Admission: EM | Admit: 2021-08-19 | Discharge: 2021-08-19 | Disposition: A | Discharge status: Home / Self Care | Payer: PRIVATE HEALTH INSURANCE | Attending: Emergency Medicine | Admitting: Emergency Medicine

## 2021-08-19 DIAGNOSIS — W1789XA Other fall from one level to another, initial encounter: Secondary | ICD-10-CM | POA: Diagnosis not present

## 2021-08-19 DIAGNOSIS — S6992XA Unspecified injury of left wrist, hand and finger(s), initial encounter: Secondary | ICD-10-CM | POA: Diagnosis present

## 2021-08-19 DIAGNOSIS — S92062A Displaced intraarticular fracture of left calcaneus, initial encounter for closed fracture: Secondary | ICD-10-CM | POA: Insufficient documentation

## 2021-08-19 DIAGNOSIS — S92002A Unspecified fracture of left calcaneus, initial encounter for closed fracture: Secondary | ICD-10-CM

## 2021-08-19 DIAGNOSIS — S52502A Unspecified fracture of the lower end of left radius, initial encounter for closed fracture: Secondary | ICD-10-CM

## 2021-08-19 DIAGNOSIS — S52572A Other intraarticular fracture of lower end of left radius, initial encounter for closed fracture: Secondary | ICD-10-CM | POA: Diagnosis not present

## 2021-08-19 DIAGNOSIS — S52612A Displaced fracture of left ulna styloid process, initial encounter for closed fracture: Secondary | ICD-10-CM | POA: Diagnosis not present

## 2021-08-19 LAB — CBC WITH DIFFERENTIAL/PLATELET
Abs Immature Granulocytes: 0.03 10*3/uL (ref 0.00–0.07)
Basophils Absolute: 0 10*3/uL (ref 0.0–0.1)
Basophils Relative: 1 %
Eosinophils Absolute: 0 10*3/uL (ref 0.0–0.5)
Eosinophils Relative: 0 %
HCT: 37.9 % — ABNORMAL LOW (ref 39.0–52.0)
Hemoglobin: 12.2 g/dL — ABNORMAL LOW (ref 13.0–17.0)
Immature Granulocytes: 0 %
Lymphocytes Relative: 13 %
Lymphs Abs: 1 10*3/uL (ref 0.7–4.0)
MCH: 28.4 pg (ref 26.0–34.0)
MCHC: 32.2 g/dL (ref 30.0–36.0)
MCV: 88.3 fL (ref 80.0–100.0)
Monocytes Absolute: 0.4 10*3/uL (ref 0.1–1.0)
Monocytes Relative: 5 %
Neutro Abs: 6.2 10*3/uL (ref 1.7–7.7)
Neutrophils Relative %: 81 %
Platelets: 243 10*3/uL (ref 150–400)
RBC: 4.29 MIL/uL (ref 4.22–5.81)
RDW: 12.5 % (ref 11.5–15.5)
WBC: 7.7 10*3/uL (ref 4.0–10.5)
nRBC: 0 % (ref 0.0–0.2)

## 2021-08-19 LAB — COMPREHENSIVE METABOLIC PANEL
ALT: 55 U/L — ABNORMAL HIGH (ref 0–44)
AST: 46 U/L — ABNORMAL HIGH (ref 15–41)
Albumin: 4.4 g/dL (ref 3.5–5.0)
Alkaline Phosphatase: 83 U/L (ref 38–126)
Anion gap: 9 (ref 5–15)
BUN: 21 mg/dL — ABNORMAL HIGH (ref 6–20)
CO2: 25 mmol/L (ref 22–32)
Calcium: 8.8 mg/dL — ABNORMAL LOW (ref 8.9–10.3)
Chloride: 103 mmol/L (ref 98–111)
Creatinine, Ser: 0.97 mg/dL (ref 0.61–1.24)
GFR, Estimated: >60 mL/min (ref >60)
Glucose, Bld: 86 mg/dL (ref 70–99)
Potassium: 4.1 mmol/L (ref 3.5–5.1)
Sodium: 137 mmol/L (ref 135–145)
Total Bilirubin: 0.5 mg/dL (ref 0.3–1.2)
Total Protein: 8.5 g/dL — ABNORMAL HIGH (ref 6.5–8.1)

## 2021-08-19 LAB — LIPASE, BLOOD: Lipase: 33 U/L (ref 11–51)

## 2021-08-19 LAB — URINALYSIS, ROUTINE W REFLEX MICROSCOPIC
Bilirubin Urine: NEGATIVE
Glucose, UA: NEGATIVE mg/dL
Hgb urine dipstick: NEGATIVE
Ketones, ur: NEGATIVE mg/dL
Leukocytes,Ua: NEGATIVE
Nitrite: NEGATIVE
Protein, ur: NEGATIVE mg/dL
Specific Gravity, Urine: 1.005 (ref 1.005–1.030)
pH: 5 (ref 5.0–8.0)

## 2021-08-19 LAB — TYPE AND SCREEN
ABO/RH(D): A NEG
Antibody Screen: NEGATIVE

## 2021-08-19 LAB — ETHANOL: Alcohol, Ethyl (B): 168 mg/dL — ABNORMAL HIGH (ref <10)

## 2021-08-19 MED ORDER — LIDOCAINE HCL 1 % IJ SOLN
15.0000 mL | Freq: Once | INTRAMUSCULAR | Status: AC
  Filled 2021-08-19: qty 20

## 2021-08-19 MED ORDER — HYDROMORPHONE HCL 1 MG/ML IJ SOLN
1.0000 mg | Freq: Once | INTRAMUSCULAR | Status: AC
  Administered 2021-08-19: 1 mg via INTRAVENOUS
  Filled 2021-08-19: qty 1

## 2021-08-19 MED ORDER — ONDANSETRON 4 MG PO TBDP
4.0000 mg | ORAL_TABLET | Freq: Once | ORAL | Status: AC
  Administered 2021-08-19: 4 mg via ORAL
  Filled 2021-08-19: qty 1

## 2021-08-19 MED ORDER — BUPIVACAINE HCL (PF) 0.5 % IJ SOLN
10.0000 mL | Freq: Once | INTRAMUSCULAR | Status: AC
  Administered 2021-08-19: 10 mL
  Filled 2021-08-19: qty 10

## 2021-08-19 MED ORDER — OXYCODONE-ACETAMINOPHEN 10-325 MG PO TABS: 1.0000 | ORAL_TABLET | Freq: Four times a day (QID) | ORAL | 0 refills | Status: AC | PRN

## 2021-08-19 MED ORDER — OXYCODONE-ACETAMINOPHEN 5-325 MG PO TABS
2.0000 | ORAL_TABLET | Freq: Once | ORAL | Status: AC
  Administered 2021-08-19: 2 via ORAL
  Filled 2021-08-19: qty 2

## 2021-08-19 MED ORDER — IOHEXOL 300 MG/ML  SOLN
100.0000 mL | Freq: Once | INTRAMUSCULAR | Status: AC | PRN
  Administered 2021-08-19: 100 mL via INTRAVENOUS

## 2021-08-19 MED ORDER — FENTANYL CITRATE PF 50 MCG/ML IJ SOSY
50.0000 ug | PREFILLED_SYRINGE | Freq: Once | INTRAMUSCULAR | Status: AC
  Administered 2021-08-19: 50 ug via INTRAVENOUS
  Filled 2021-08-19: qty 1

## 2021-08-19 MED ORDER — MORPHINE SULFATE (PF) 4 MG/ML IV SOLN
4.0000 mg | Freq: Once | INTRAVENOUS | Status: AC
  Administered 2021-08-19: 4 mg via INTRAVENOUS
  Filled 2021-08-19: qty 1

## 2021-08-19 MED ORDER — ONDANSETRON 4 MG PO TBDP: 4.0000 mg | ORAL_TABLET | Freq: Three times a day (TID) | ORAL | 0 refills | Status: AC | PRN

## 2021-08-19 MED ORDER — LIDOCAINE HCL 1 % IJ SOLN
INTRAMUSCULAR | Status: AC
  Administered 2021-08-19: 15 mL
  Filled 2021-08-19: qty 10

## 2021-08-19 MED ORDER — FENTANYL CITRATE PF 50 MCG/ML IJ SOSY: 50.0000 ug | PREFILLED_SYRINGE | Freq: Once | INTRAMUSCULAR | Status: DC

## 2021-08-19 NOTE — ED Notes (Signed)
Pt wife at bedside.

## 2021-08-19 NOTE — ED Triage Notes (Addendum)
Pt comes with c/o left wrist injury and left ankle after fall. Pt states he was 10 feet high on concrete wall and fell the patient states no loc or hitting head. Pt does not wish to file for workers comp.  Pt states he landed on his left side.

## 2021-08-19 NOTE — ED Provider Notes (Signed)
[facility] Provider Note  Patient Contact: 3:54 PM (approximate)   History   Fall   HPI  Thomas Dorsey is a 46 y.o. male with a history of alcohol use and back pain, presents to the emergency department after he fell 10 feet from a concrete wall.  Patient is primarily complaining of left wrist pain and left foot pain.  Patient denies hitting his head during fall.  He denies chest pain, chest tightness or abdominal pain.  He sustained no lacerations.  He states that he has been unable to bear weight since injury occurred.      Physical Exam   Triage Vital Signs: ED Triage Vitals  Enc Vitals Group     BP 08/19/21 1517 124/85     Pulse Rate 08/19/21 1517 74     Resp 08/19/21 1517 18     Temp 08/19/21 1517 98.4 F (36.9 C)     Temp src --      SpO2 08/19/21 1517 94 %     Weight 08/19/21 1520 210 lb 1.6 oz (95.3 kg)     Height 08/19/21 1520 5\' 11"  (1.803 m)     Head Circumference --      Peak Flow --      Pain Score 08/19/21 1513 10     Pain Loc --      Pain Edu? --      Excl. in GC? --     Most recent vital signs: Vitals:   08/19/21 1517 08/19/21 1716  BP: 124/85 (!) 130/95  Pulse: 74 77  Resp: 18 18  Temp: 98.4 F (36.9 C)   SpO2: 94% 96%     General: Patient seems subdued and slightly confused. Eyes:  PERRL. EOMI. Head: No acute traumatic findings.  No step-off deformities.  No facial lacerations. ENT:      Nose: No congestion/rhinnorhea.      Mouth/Throat: Mucous membranes are moist.  Neck: No stridor. No cervical spine tenderness to palpation.  Cardiovascular:  Good peripheral perfusion Respiratory: Normal respiratory effort without tachypnea or retractions. Lungs CTAB. Good air entry to the bases with no decreased or absent breath sounds. Gastrointestinal: Bowel sounds 4 quadrants. Soft and nontender to palpation. No guarding or rigidity. No palpable masses. No distention. No CVA tenderness. Musculoskeletal: Patient  performs limited range of motion at the left wrist and has significant swelling of the left wrist.  He is able to move all 5 left fingers.  Palpable radial and ulnar pulses bilaterally and symmetrically.  Capillary refill less than 2 seconds on the left. Patient has swelling over the dorsal aspect of the left foot.  Palpable dorsalis pedis pulse, left. Neurologic:  No gross focal neurologic deficits are appreciated.  Skin:   No rash noted Other:   ED Results / Procedures / Treatments   Labs (all labs ordered are listed, but only abnormal results are displayed) Labs Reviewed  CBC WITH DIFFERENTIAL/PLATELET - Abnormal; Notable for the following components:      Result Value   Hemoglobin 12.2 (*)    HCT 37.9 (*)    All other components within normal limits  COMPREHENSIVE METABOLIC PANEL - Abnormal; Notable for the following components:   BUN 21 (*)    Calcium 8.8 (*)    Total Protein 8.5 (*)    AST 46 (*)    ALT 55 (*)    All other components within normal limits  URINALYSIS, ROUTINE W REFLEX MICROSCOPIC - Abnormal; Notable  for the following components:   Color, Urine STRAW (*)    APPearance CLEAR (*)    All other components within normal limits  ETHANOL - Abnormal; Notable for the following components:   Alcohol, Ethyl (B) 168 (*)    All other components within normal limits  LIPASE, BLOOD  TYPE AND SCREEN        RADIOLOGY  I personally viewed and evaluated these images as part of my medical decision making, as well as reviewing the written report by the radiologist.  ED Provider Interpretation: Patient has comminuted distal left radius fracture with dorsal angulation and nondisplaced calcaneal fracture on the left.   PROCEDURES:  Critical Care performed: No  Reduction of fracture  Date/Time: 08/19/2021 5:22 PM  Performed by: Orvil Feil, PA-C Authorized by: Orvil Feil, PA-C  Consent: Verbal consent obtained. Consent given by: patient Patient  understanding: patient states understanding of the procedure being performed Patient identity confirmed: verbally with patient Preparation: Patient was prepped and draped in the usual sterile fashion. Local anesthesia used: yes  Anesthesia: Local anesthesia used: yes Local Anesthetic: bupivacaine 0.5% without epinephrine and lidocaine 1% without epinephrine Anesthetic total: 8 mL  Sedation: Patient sedated: yes Sedation type: anxiolysis Analgesia: hydromorphone       MEDICATIONS ORDERED IN ED: Medications  ondansetron (ZOFRAN-ODT) disintegrating tablet 4 mg (4 mg Oral Given 08/19/21 1627)  fentaNYL (SUBLIMAZE) injection 50 mcg (50 mcg Intravenous Given 08/19/21 1627)  lidocaine (XYLOCAINE) 1 % (with pres) injection 15 mL (15 mLs Infiltration Given by Other 08/19/21 1657)  bupivacaine(PF) (MARCAINE) 0.5 % injection 10 mL (10 mLs Infiltration Given by Other 08/19/21 1656)  HYDROmorphone (DILAUDID) injection 1 mg (1 mg Intravenous Given 08/19/21 1710)  iohexol (OMNIPAQUE) 300 MG/ML solution 100 mL (100 mLs Intravenous Contrast Given 08/19/21 1726)  morphine (PF) 4 MG/ML injection 4 mg (4 mg Intravenous Given 08/19/21 1833)  oxyCODONE-acetaminophen (PERCOCET/ROXICET) 5-325 MG per tablet 2 tablet (2 tablets Oral Given 08/19/21 1849)     IMPRESSION / MDM / ASSESSMENT AND PLAN / ED COURSE  I reviewed the triage vital signs and the nursing notes.                              Assessment and plan Fall:  46 year old male presents to the emergency department after he fell approximately 10 feet from a concrete wall.  Vital signs were reassuring at triage.  On exam, patient seems slightly confused and subdued.  He had no other neurodeficits appreciated with testing.  Significant swelling of the left wrist and of the left foot but was neurovascularly intact.  Initial x-rays were concerning for comminuted distal radius fracture and a nondisplaced calcaneal fracture.  Given mechanism of injury,  will obtain CTs of the head, cervical spine, chest, abdomen, pelvis and left foot.  Will consult orthopedics regarding need for possible reduction.   I discussed patient case with orthopedic attending, Dr. Okey Dupre who recommended reduction of distal radius fracture on the left with hematoma block.  Patient received hematoma block in the emergency department and underwent bedside reduction.  Patient had significantly improved alignment after reduction.  Patient was given follow-up instructions for Dr. Okey Dupre.  I reviewed patient case with podiatrist on-call, Dr. Ether Griffins who recommended a posterior padded splint and nonweightbearing status.  Offered admission for patient for pain control and patient declined.  He was prescribed Percocet for pain and return precautions were given to return with new  or worsening symptoms.    FINAL CLINICAL IMPRESSION(S) / ED DIAGNOSES   Final diagnoses:  Closed fracture of distal end of left radius, unspecified fracture morphology, initial encounter  Closed displaced fracture of left calcaneus, unspecified portion of calcaneus, initial encounter     Rx / DC Orders   ED Discharge Orders          Ordered    oxyCODONE-acetaminophen (PERCOCET) 10-325 MG tablet  Every 6 hours PRN        08/19/21 1852    ondansetron (ZOFRAN-ODT) 4 MG disintegrating tablet  Every 8 hours PRN        08/19/21 1852             Note:  This document was prepared using Dragon voice recognition software and may include unintentional dictation errors.   Pia Mau Panther Burn, PA-C 08/19/21 1945    Minna Antis, MD 08/20/21 2240

## 2021-08-19 NOTE — ED Notes (Signed)
Pt reports that he was at work and fell from approximately 10 feet. Pt states that he landed on his left side. Pt states that he has pain in his left ankle and left wrist. Pt did not hit his head.

## 2021-10-03 HISTORY — PX: FOOT SURGERY: SHX648

## 2022-10-19 ENCOUNTER — Ambulatory Visit: Payer: Managed Care, Other (non HMO) | Admitting: Family Medicine

## 2022-10-19 ENCOUNTER — Encounter: Payer: Self-pay | Admitting: Family Medicine

## 2022-10-19 VITALS — BP 148/89 | HR 87 | Temp 98.5°F | Ht 71.0 in | Wt 224.7 lb

## 2022-10-19 DIAGNOSIS — Z1211 Encounter for screening for malignant neoplasm of colon: Secondary | ICD-10-CM

## 2022-10-19 DIAGNOSIS — Z0000 Encounter for general adult medical examination without abnormal findings: Secondary | ICD-10-CM

## 2022-10-19 DIAGNOSIS — Z0001 Encounter for general adult medical examination with abnormal findings: Secondary | ICD-10-CM | POA: Diagnosis not present

## 2022-10-19 DIAGNOSIS — B182 Chronic viral hepatitis C: Secondary | ICD-10-CM | POA: Diagnosis not present

## 2022-10-19 DIAGNOSIS — K219 Gastro-esophageal reflux disease without esophagitis: Secondary | ICD-10-CM

## 2022-10-19 DIAGNOSIS — D649 Anemia, unspecified: Secondary | ICD-10-CM | POA: Diagnosis not present

## 2022-10-19 DIAGNOSIS — F1921 Other psychoactive substance dependence, in remission: Secondary | ICD-10-CM | POA: Diagnosis not present

## 2022-10-19 DIAGNOSIS — Z23 Encounter for immunization: Secondary | ICD-10-CM | POA: Diagnosis not present

## 2022-10-19 DIAGNOSIS — I10 Essential (primary) hypertension: Secondary | ICD-10-CM

## 2022-10-19 DIAGNOSIS — H6591 Unspecified nonsuppurative otitis media, right ear: Secondary | ICD-10-CM

## 2022-10-19 MED ORDER — PANTOPRAZOLE SODIUM 40 MG PO TBEC: 40.0000 mg | DELAYED_RELEASE_TABLET | Freq: Two times a day (BID) | ORAL | 0 refills | Status: DC

## 2022-10-19 MED ORDER — FAMOTIDINE 20 MG PO TABS: 20.0000 mg | ORAL_TABLET | Freq: Two times a day (BID) | ORAL | 0 refills | Status: AC

## 2022-10-19 MED ORDER — LISINOPRIL 10 MG PO TABS: 10.0000 mg | ORAL_TABLET | Freq: Every day | ORAL | 0 refills | Status: DC

## 2022-10-19 MED ORDER — PANTOPRAZOLE SODIUM 40 MG PO TBEC: DELAYED_RELEASE_TABLET | ORAL | 0 refills | Status: DC

## 2022-10-19 NOTE — Assessment & Plan Note (Signed)
Sustained full remission Last use >10 years ago Priorities facilitating remission: Family and health, in that order

## 2022-10-19 NOTE — Patient Instructions (Signed)

## 2022-10-19 NOTE — Addendum Note (Signed)
Addended by: Marjie Skiff on: 10/19/2022 09:35 AM   Modules accepted: Orders

## 2022-10-19 NOTE — Assessment & Plan Note (Signed)
Diagnosed at 47 years of age Complicated treatment of overdose in his 25s but no problems since Never treated due to financial constraints Refer to GI as noted below

## 2022-10-19 NOTE — Assessment & Plan Note (Addendum)
Start pantoprazole 40mg  BID for two weeks If helpful, will transition to 40 mg daily Start famotidine 20mg  BID x1 month Refer to GI for further evaluation/recommendations

## 2022-10-19 NOTE — Progress Notes (Signed)
New patient visit   Patient: Thomas Dorsey   DOB: 31-May-1975   47 y.o. Male  MRN: [id number] Visit Date: 10/19/2022  Today's healthcare provider: Sherlyn Hay, DO   No chief complaint on file.  Subjective    Thomas Dorsey is a 47 y.o. male who presents today as a new patient to establish care.  HPI  Colonoscopy? Due; has aunt with history of colon cancer History of mild normocytic anemia Father died of MI at 9 Yo  - very heavy drinker and smoker (2-3 ppd)  Had surgery on right heel x3; recently restarted work.  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  - had frank blood normally, though this and the hemorrhoids resolved while he was out of work.  Has bad reflux  - has to wait five hours after dinner because of reflux.  Sits up to sleep frequently because of this.  - Uses OTC prilosec - had been taking two pills TID but decreased d/t inefectiveness  - Eats Tums around the clock.  Has had hep C since he was 47 Yo; has not had medical concerns from it since that initial diagnosis. 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  - couldn't afford to pay for the treatment  08/31/2022 at Biometric Screening:  BP 160/70  BMI 31.3 Weight 224.8 lb Waist: 42 in T. Chol 172 HDL 51 LDL 111 VLDL 10 Tri 51  Glucose 84 Hgb A1c 5.1    Past Medical History:  Diagnosis Date   Back pain    ETOH abuse    Polysubstance abuse (HCC)    Past Surgical History:  Procedure Laterality Date   HAND SURGERY Right 2014   Family Status  Relation Name Status   Mother  (Not Specified)   Father  (Not Specified)   Mat Aunt  (Not Specified)   PGM  (Not Specified)   PGF  (Not Specified)  No partnership data on file   Family History   Problem Relation Age of Onset   Hypertension Mother    Heart disease Father    Sudden death Father    Colon cancer Maternal Aunt    Ovarian cancer Paternal Grandmother    Diabetes Paternal Grandfather    Social History   Socioeconomic History   Marital status: Single    Spouse name: Not on file   Number of children: Not on file   Years of education: Not on file   Highest education level: Not on file  Occupational History   Not on file  Tobacco Use   Smoking status: Former    Current packs/day: 1.00    Types: Cigarettes   Smokeless tobacco: Current    Types: Snuff  Vaping Use   Vaping status: Every Day  Substance and Sexual Activity   Alcohol use: Not Currently    Alcohol/week: 0.0 standard drinks of alcohol    Comment: Hx ETOH abuse   Drug use: Not Currently    Comment: History of polysubstance abuse.    Sexual activity: Yes  Other Topics Concern   Not on file  Social History Narrative   Not on file   Social Determinants of Health   Financial Resource  Strain: Not on file  Food Insecurity: Not on file  Transportation Needs: Not on file  Physical Activity: Not on file  Stress: Not on file  Social Connections: Not on file   Outpatient Medications Prior to Visit  Medication Sig   ibuprofen (ADVIL) 600 MG tablet Take 600 mg by mouth every 6 (six) hours as needed. Takes as needed   triamcinolone ointment (KENALOG) 0.5 % Apply 1 application topically 2 (two) times daily.   [DISCONTINUED] Ledipasvir-Sofosbuvir (HARVONI) 90-400 MG TABS Take 1 tablet by mouth daily. (Patient not taking: Reported on 12/10/2015)   No facility-administered medications prior to visit.   No Known Allergies  Immunization History  Administered Date(s) Administered   Hepatitis A, Adult 05/06/2015   Hepatitis B, ADULT 05/06/2015   Influenza, Seasonal, Injecte, Preservative Fre 10/19/2022   Tdap 01/23/2019    Health Maintenance  Topic Date Due   Colonoscopy  Never done   COVID-19  Vaccine (1 - 2023-24 season) 11/04/2022 (Originally 09/04/2022)   DTaP/Tdap/Td (2 - Td or Tdap) 01/22/2029   INFLUENZA VACCINE  Completed   Hepatitis C Screening  Completed   HIV Screening  Completed   HPV VACCINES  Aged Out    Patient Care Team: Jaziel Bennett, Monico Blitz, DO as PCP - General (Family Medicine)  Review of Systems  Constitutional:  Negative for appetite change, chills, fatigue and fever.  HENT:  Negative for congestion, ear pain, hearing loss, nosebleeds and trouble swallowing.   Eyes:  Negative for pain and visual disturbance.  Respiratory:  Negative for cough, chest tightness and shortness of breath.   Cardiovascular:  Negative for chest pain, palpitations and leg swelling.  Gastrointestinal:  Negative for abdominal pain, blood in stool, constipation, diarrhea, nausea and vomiting.  Endocrine: Negative for polydipsia, polyphagia and polyuria.  Genitourinary:  Negative for dysuria and flank pain.  Musculoskeletal:  Positive for arthralgias and back pain (has bulging disc in low back). Negative for joint swelling, myalgias and neck stiffness.  Skin:  Negative for color change, rash and wound.  Neurological:  Negative for dizziness, tremors, seizures, speech difficulty, weakness, light-headedness and headaches.  Psychiatric/Behavioral:  Negative for behavioral problems, confusion, decreased concentration, dysphoric mood and sleep disturbance. The patient is not nervous/anxious.   All other systems reviewed and are negative.       Objective    BP (!) 148/89   Pulse 87   Temp 98.5 F (36.9 C) (Oral)   Ht 5\' 11"  (1.803 m)   Wt 224 lb 11.2 oz (101.9 kg)   SpO2 99%   BMI 31.34 kg/m     Physical Exam Vitals and nursing note reviewed.  Constitutional:      General: He is awake.     Appearance: Normal appearance.  HENT:     Head: Normocephalic and atraumatic.     Right Ear: Ear canal and external ear normal. A middle ear effusion (mild, clear, air fluid levels present) is  present.     Left Ear: Tympanic membrane, ear canal and external ear normal.     Nose: Nose normal.     Mouth/Throat:     Mouth: Mucous membranes are moist.     Pharynx: Oropharynx is clear. No oropharyngeal exudate or posterior oropharyngeal erythema.  Eyes:     General: No scleral icterus.    Extraocular Movements: Extraocular movements intact.     Conjunctiva/sclera: Conjunctivae normal.     Pupils: Pupils are equal, round, and reactive to light.  Neck:  Thyroid: No thyromegaly or thyroid tenderness.  Cardiovascular:     Rate and Rhythm: Normal rate and regular rhythm.     Pulses: Normal pulses.     Heart sounds: Normal heart sounds.  Pulmonary:     Effort: Pulmonary effort is normal. No tachypnea, bradypnea or respiratory distress.     Breath sounds: Normal breath sounds. No stridor. No wheezing, rhonchi or rales.  Abdominal:     General: Bowel sounds are normal. There is no distension.     Palpations: Abdomen is soft. There is no mass.     Tenderness: There is no abdominal tenderness. There is no guarding.     Hernia: No hernia is present.  Musculoskeletal:     Cervical back: Normal range of motion and neck supple.     Right lower leg: No edema.     Left lower leg: No edema.  Lymphadenopathy:     Cervical: No cervical adenopathy.  Skin:    General: Skin is warm and dry.  Neurological:     Mental Status: He is alert and oriented to person, place, and time. Mental status is at baseline.  Psychiatric:        Mood and Affect: Mood normal.        Behavior: Behavior normal.      Depression Screen    10/19/2022    8:37 AM  PHQ 2/9 Scores  PHQ - 2 Score 0  PHQ- 9 Score 0   No results found for any visits on 10/19/22.  Assessment & Plan     Encounter for medical examination to establish care Assessment & Plan: Physical exam overall unremarkable except as noted above. Routine lab work ordered as noted.  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, early partial, sustained full, or sustained partial remission (HCC) Assessment & Plan: Sustained full remission Last use >10 years ago Priorities facilitating remission: Family and health, in that order   Chronic hepatitis C without hepatic coma (HCC) Assessment & Plan: Diagnosed at 47 years of age Complicated treatment of overdose in his 79s but no problems since Never treated due to financial constraints Refer to GI as noted below  Orders: -     Ambulatory referral to Gastroenterology -     Hepatitis C RNA quantitative  Normocytic anemia Assessment & Plan: Patient's previous 2 CBCs showed normocytic anemia.   Will recheck today.  Orders: -     CBC with Differential/Platelet  Gastroesophageal reflux disease, unspecified whether esophagitis present Assessment & Plan: Start pantoprazole 40mg  BID for two weeks If helpful, will transition to 40 mg daily Start famotidine 20mg  BID x1 month Refer to GI for further evaluation/recommendations  Orders: -     Famotidine; Take 1 tablet (20 mg total) by mouth 2 (two) times daily.  Dispense: 30 tablet; Refill: 0 -     Ambulatory referral to Gastroenterology -     Pantoprazole Sodium; Take 1 tablet (40 mg total) by mouth 2 (two) times daily before a meal for 15 days, THEN 1 tablet (40 mg total) daily.  Dispense: 75 tablet; Refill: 0  Encounter for colorectal cancer screening -     Ambulatory referral to Gastroenterology  Primary hypertension Assessment & Plan: Patient has record of prior elevated blood pressures as well, including a level of 160/70 in August 2024. Start treatment with lisinopril 10 mg daily. Patient to measure blood pressures at home and increase  to 20 mg daily if blood pressures remain >130/80. Follow-up in 4 weeks  Orders: -     Lisinopril; Take 1 tablet (10 mg total) by mouth daily. 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      Return in about 4 weeks (around 11/16/2022) for HTN, GERD.     I discussed the assessment and treatment plan with the patient  The patient was provided an opportunity to ask questions and all were answered. The patient agreed with the plan and demonstrated an understanding of the instructions.   The patient was advised to call back or seek an in-person evaluation if the symptoms worsen or if the condition fails to improve as anticipated.    Sherlyn Hay, DO  [facility] [phone] (phone) [phone] (fax)  [facility]

## 2022-10-19 NOTE — Assessment & Plan Note (Signed)
Patient's previous 2 CBCs showed normocytic anemia.   Will recheck today.

## 2022-10-19 NOTE — Assessment & Plan Note (Signed)
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

## 2022-10-19 NOTE — Assessment & Plan Note (Signed)
Physical exam overall unremarkable except as noted above. Routine lab work ordered as noted.

## 2022-10-19 NOTE — Assessment & Plan Note (Signed)
Patient has record of prior elevated blood pressures as well, including a level of 160/70 in August 2024. Start treatment with lisinopril 10 mg daily. Patient to measure blood pressures at home and increase to 20 mg daily if blood pressures remain >130/80. Follow-up in 4 weeks

## 2022-10-20 ENCOUNTER — Telehealth: Payer: Self-pay | Admitting: Family Medicine

## 2022-10-20 LAB — COMPREHENSIVE METABOLIC PANEL
ALT: 74 [IU]/L — ABNORMAL HIGH (ref 0–44)
AST: 48 [IU]/L — ABNORMAL HIGH (ref 0–40)
Albumin: 4.6 g/dL (ref 4.1–5.1)
Alkaline Phosphatase: 122 [IU]/L — ABNORMAL HIGH (ref 44–121)
BUN/Creatinine Ratio: 14 (ref 9–20)
BUN: 14 mg/dL (ref 6–24)
Bilirubin Total: 0.7 mg/dL (ref 0.0–1.2)
CO2: 23 mmol/L (ref 20–29)
Calcium: 9.6 mg/dL (ref 8.7–10.2)
Chloride: 102 mmol/L (ref 96–106)
Creatinine, Ser: 1.01 mg/dL (ref 0.76–1.27)
Globulin, Total: 3.1 g/dL (ref 1.5–4.5)
Glucose: 97 mg/dL (ref 70–99)
Potassium: 5 mmol/L (ref 3.5–5.2)
Sodium: 140 mmol/L (ref 134–144)
Total Protein: 7.7 g/dL (ref 6.0–8.5)
eGFR: 92 mL/min/{1.73_m2} (ref >59)

## 2022-10-20 LAB — CBC WITH DIFFERENTIAL/PLATELET
Basophils Absolute: 0 10*3/uL (ref 0.0–0.2)
Basos: 0 %
EOS (ABSOLUTE): 0 10*3/uL (ref 0.0–0.4)
Eos: 1 %
Hematocrit: 40.3 % (ref 37.5–51.0)
Hemoglobin: 13.6 g/dL (ref 13.0–17.7)
Immature Grans (Abs): 0 10*3/uL (ref 0.0–0.1)
Immature Granulocytes: 0 %
Lymphocytes Absolute: 1 10*3/uL (ref 0.7–3.1)
Lymphs: 18 %
MCH: 29.8 pg (ref 26.6–33.0)
MCHC: 33.7 g/dL (ref 31.5–35.7)
MCV: 88 fL (ref 79–97)
Monocytes Absolute: 0.4 10*3/uL (ref 0.1–0.9)
Monocytes: 7 %
Neutrophils Absolute: 4 10*3/uL (ref 1.4–7.0)
Neutrophils: 74 %
Platelets: 221 10*3/uL (ref 150–450)
RBC: 4.57 x10E6/uL (ref 4.14–5.80)
RDW: 12.4 % (ref 11.6–15.4)
WBC: 5.4 10*3/uL (ref 3.4–10.8)

## 2022-10-20 LAB — LIPID PANEL
Chol/HDL Ratio: 2.5 {ratio} (ref 0.0–5.0)
Cholesterol, Total: 128 mg/dL (ref 100–199)
HDL: 51 mg/dL (ref >39)
LDL Chol Calc (NIH): 69 mg/dL (ref 0–99)
Triglycerides: 25 mg/dL (ref 0–149)
VLDL Cholesterol Cal: 8 mg/dL (ref 5–40)

## 2022-10-20 NOTE — Telephone Encounter (Signed)
Attempted to call patient wife, Dawn(DPR). No answer- left message to call office to clarify request.

## 2022-10-20 NOTE — Telephone Encounter (Signed)
Patient spouse called stated her pharmacy was to in network so they sent the script to [facility]. [facility] would not fill the script for pantoprazole (PROTONIX) 40 MG tablet according to the instructions. They would only give 30 pills. Spouse needs a call back as she wants to now have a script sent to [facility] as the 30 pills she picked up will be gone in 15 days. Please f/u with spouse for further instructions

## 2022-10-20 NOTE — Telephone Encounter (Signed)
Pt's wife Alvis Lemmings calling back. She states that [facility] will only approve 30 pills at 1x/day. She has picked up 30 pills but will only last 15 days for it being BID. They told her a PA would be needed to cover. She is asking if a PA can be done and also to send in a new script after the 15 days to cover 1x/day dose so maybe insurance will cover. She doesn't care if new script goes to [facility], she said whoever can ask when PA is started. Dawn is fine a call back or Mychart message updating the medication.

## 2022-10-21 ENCOUNTER — Encounter: Payer: Self-pay | Admitting: Family Medicine

## 2022-10-21 ENCOUNTER — Ambulatory Visit: Payer: Self-pay

## 2022-10-21 ENCOUNTER — Other Ambulatory Visit: Payer: Self-pay | Admitting: Family Medicine

## 2022-10-21 DIAGNOSIS — K219 Gastro-esophageal reflux disease without esophagitis: Secondary | ICD-10-CM

## 2022-10-21 LAB — HCV RNA QUANT
HCV log10: 4.418 {Log}
Hepatitis C Quantitation: 26200 [IU]/mL

## 2022-10-21 MED ORDER — PANTOPRAZOLE SODIUM 40 MG PO TBEC: 40.0000 mg | DELAYED_RELEASE_TABLET | Freq: Every day | ORAL | 1 refills | Status: DC

## 2022-10-21 NOTE — Telephone Encounter (Signed)
Summary: med management   Pt wife stated she was finally able to pick up BP medication lisinopril (ZESTRIL) 10 MG. She stated the bottle has a disclamer to check the paper provided for directions, but directions were not provided on how to take medications. Paperwork was not attached.  The wife stated she does not want to deal with the pharmacy again and is seeking clinical advice.     Chief Complaint: Wife asking to review instructions on lisinopril. Verbalizes understanding. States instructions not on bottle. Symptoms: n/a Frequency: n/a Pertinent Negatives: Patient denies  Disposition: [] ED /[] Urgent Care (no appt availability in office) / [] Appointment(In office/virtual)/ []  [facility]/ [x] Home Care/ [] Refused Recommended Disposition /[]  Mobile Bus/ []  Follow-up with PCP Additional Notes: Verbalizes understanding.  Reason for Disposition  Caller has medicine question only, adult not sick, AND triager answers question  Answer Assessment - Initial Assessment Questions 1. NAME of MEDICINE: "What medicine(s) are you calling about?"     Lisinopril 2. QUESTION: "What is your question?" (e.g., double dose of medicine, side effect)     Wife asking to review instructions, verbalizes understanding. 3. PRESCRIBER: "Who prescribed the medicine?" Reason: if prescribed by specialist, call should be referred to that group.     Dr. Payton Mccallum 4. SYMPTOMS: "Do you have any symptoms?" If Yes, ask: "What symptoms are you having?"  "How bad are the symptoms (e.g., mild, moderate, severe)     N/a 5. PREGNANCY:  "Is there any chance that you are pregnant?" "When was your last menstrual period?"     N/a  Protocols used: Medication Question Call-A-AH

## 2022-10-31 ENCOUNTER — Other Ambulatory Visit: Payer: Self-pay | Admitting: Family Medicine

## 2022-10-31 DIAGNOSIS — I10 Essential (primary) hypertension: Secondary | ICD-10-CM

## 2022-10-31 MED ORDER — LISINOPRIL 20 MG PO TABS: 20.0000 mg | ORAL_TABLET | Freq: Every day | ORAL | 3 refills | Status: AC

## 2022-10-31 NOTE — Progress Notes (Signed)
SBP still elevated >130 per patient's wife. Will send increased dose to pt pharmacy.

## 2022-11-10 ENCOUNTER — Other Ambulatory Visit: Payer: Self-pay | Admitting: Family Medicine

## 2022-11-10 DIAGNOSIS — K219 Gastro-esophageal reflux disease without esophagitis: Secondary | ICD-10-CM

## 2022-11-10 MED ORDER — PANTOPRAZOLE SODIUM 40 MG PO TBEC: 40.0000 mg | DELAYED_RELEASE_TABLET | Freq: Every day | ORAL | 0 refills | Status: DC

## 2022-11-10 NOTE — Telephone Encounter (Signed)
Medication Refill -  Most Recent Primary Care Visit:  Provider: Sherlyn Hay  Department: [facility]  Visit Type: NEW PATIENT  Date: 10/19/2022  Medication: pantoprazole (PROTONIX) 40 MG tablet [26225]    need a few days worth.. mail delivery will not arrive until November 15.      Has the patient contacted their pharmacy? Yes (Is this the correct pharmacy for this prescription? Yes If no, delete pharmacy and type the correct one.  This is the patient's preferred pharmacy:   [id number] [place], [place] - [address] AT [facility] [address]  [address] [place] [address]  Phone: [phone] Fax: [phone]  Hours: Not open 24 hours    Has the prescription been filled recently? Yes  Is the patient out of the medication? Yes  Has the patient been seen for an appointment in the last year OR does the patient have an upcoming appointment? Yes  Can we respond through MyChart?   Agent: Please be advised that Rx refills may take up to 3 business days. We ask that you follow-up with your pharmacy.

## 2022-11-10 NOTE — Telephone Encounter (Signed)
Requested Prescriptions  Pending Prescriptions Disp Refills   pantoprazole (PROTONIX) 40 MG tablet 30 tablet 0    Sig: Take 1 tablet (40 mg total) by mouth daily.     Gastroenterology: Proton Pump Inhibitors Passed - 11/10/2022 11:32 AM      Passed - Valid encounter within last 12 months    Recent Outpatient Visits           3 weeks ago Encounter for medical examination to establish care   [facility] Pardue, Monico Blitz, DO       Future Appointments             In 6 days Pardue, Monico Blitz, DO [facility], [facility]

## 2022-11-16 ENCOUNTER — Ambulatory Visit: Payer: Managed Care, Other (non HMO) | Admitting: Family Medicine

## 2022-11-16 ENCOUNTER — Other Ambulatory Visit: Payer: Self-pay | Admitting: Family Medicine

## 2022-11-16 ENCOUNTER — Telehealth: Payer: Self-pay | Admitting: Family Medicine

## 2022-11-16 NOTE — Telephone Encounter (Signed)
Pt's wife called in with # to call [employer] for PA on Pantoprazole/Protonix , and famotidine (PEPCID) 20 MG tablet  because pt needs to take 2 a day instead of one and needs asap. She is also asking for 90 day supply

## 2022-11-16 NOTE — Progress Notes (Deleted)
Established patient visit   Patient: Thomas Dorsey   DOB: [birth year]   47 y.o. Male  MRN: [id number] Visit Date: 11/16/2022  Today's healthcare provider: Sherlyn Hay, DO   No chief complaint on file.  Subjective    HPI .hpisecconsentabridge  Hypertension, follow-up  BP Readings from Last 3 Encounters:  10/19/22 (!) 148/89  08/19/21 (!) 130/95  08/03/20 125/84   Wt Readings from Last 3 Encounters:  10/19/22 224 lb 11.2 oz (101.9 kg)  08/19/21 210 lb 1.6 oz (95.3 kg)  08/03/20 210 lb (95.3 kg)     He was last seen for hypertension 4 weeks ago.  BP at that visit was 148/89. Management since that visit includes start lisinopril 10 mg daily.  He reports {excellent/good/fair/poor:19665} compliance with treatment. He {is/is not:9024} having side effects. {document side effects if present:1} He is following a {diet:21022986} diet. He {is/is not:9024} exercising. He does smoke (everyday vaping); smokeless tobacco - snuff.  Use of agents associated with hypertension: {bp agents assoc with hypertension:511::"none"}.   Outside blood pressures are {***enter patient reported home BP readings, or 'not being checked':1}. Symptoms: No chest pain No chest pressure  No palpitations No syncope  No dyspnea No orthopnea  No paroxysmal nocturnal dyspnea {Yes/No:20286} lower extremity edema   Pertinent labs Lab Results  Component Value Date   CHOL 128 10/19/2022   HDL 51 10/19/2022   LDLCALC 69 10/19/2022   TRIG 25 10/19/2022   CHOLHDL 2.5 10/19/2022   Lab Results  Component Value Date   NA 140 10/19/2022   K 5.0 10/19/2022   CREATININE 1.01 10/19/2022   EGFR 92 10/19/2022   GLUCOSE 97 10/19/2022   TSH 5.17 (H) 06/21/2012     The ASCVD Risk score (Arnett DK, et al., 2019) failed to calculate for the following reasons:   The valid total cholesterol range is 130 to 320  mg/dL  ---------------------------------------------------------------------------------------------------   ***  {History (Optional):23778}  Medications: Outpatient Medications Prior to Visit  Medication Sig   famotidine (PEPCID) 20 MG tablet Take 1 tablet (20 mg total) by mouth 2 (two) times daily.   ibuprofen (ADVIL) 600 MG tablet Take 600 mg by mouth every 6 (six) hours as needed. Takes as needed   lisinopril (ZESTRIL) 20 MG tablet Take 1 tablet (20 mg total) by mouth daily.   pantoprazole (PROTONIX) 40 MG tablet Take 1 tablet (40 mg total) by mouth daily.   triamcinolone ointment (KENALOG) 0.5 % Apply 1 application topically 2 (two) times daily.   No facility-administered medications prior to visit.    Review of Systems  ***  {Insert previous labs (optional):23779} {See past labs  Heme  Chem  Endocrine  Serology  Results Review (optional):1}   Objective    There were no vitals taken for this visit. {Insert last BP/Wt (optional):23777}{See vitals history (optional):1}   Physical Exam  .pesec  No results found for any visits on 11/16/22.  Assessment & Plan    There are no diagnoses linked to this encounter. Marland Kitchenapsecabridge  ***  No follow-ups on file.      I discussed the assessment and treatment plan with the patient  The patient was provided an opportunity to ask questions and all were answered. The patient agreed with the plan and demonstrated an understanding of the instructions.   The patient was advised to call back or seek an in-person evaluation if the symptoms worsen or if the condition fails to improve as anticipated.  Sherlyn Hay, DO  [facility] [phone] (phone) [phone] (fax)  [facility]

## 2022-11-18 NOTE — Telephone Encounter (Signed)
Please review.  Patient's wife is saying patient take 2 a day instead of one. I see Pantoprazole is written for one a day and Pecid is 2 times a day. Per wife the Pantoprazole should be written 2 times a day for 90 day supply. Please review and send in for patient. Thanks.  Have created PA  [facility] not able to proceed due to not matching correct information on patient with insurance. Will wait on patient's wife to send copy of insurance Rx card

## 2022-11-21 NOTE — Telephone Encounter (Signed)
Patient has been notified that the pantoprazole is once daily and that he needed to call and schedule with GI.

## 2022-11-24 ENCOUNTER — Encounter: Payer: Self-pay | Admitting: Family Medicine

## 2022-11-30 ENCOUNTER — Ambulatory Visit: Payer: Managed Care, Other (non HMO) | Admitting: Family Medicine

## 2022-11-30 NOTE — Progress Notes (Deleted)
      Established patient visit   Patient: Thomas Dorsey   DOB: 11-Apr-1975   47 y.o. Male  MRN: [id number] Visit Date: 11/30/2022  Today's healthcare provider: Sherlyn Hay, DO   No chief complaint on file.  Subjective    HPI Able to get famotidine??? Consder sulcrafate  ***  {History (Optional):23778}  Medications: Outpatient Medications Prior to Visit  Medication Sig   famotidine (PEPCID) 20 MG tablet Take 1 tablet (20 mg total) by mouth 2 (two) times daily.   ibuprofen (ADVIL) 600 MG tablet Take 600 mg by mouth every 6 (six) hours as needed. Takes as needed   lisinopril (ZESTRIL) 20 MG tablet Take 1 tablet (20 mg total) by mouth daily.   pantoprazole (PROTONIX) 40 MG tablet Take 1 tablet (40 mg total) by mouth daily.   triamcinolone ointment (KENALOG) 0.5 % Apply 1 application topically 2 (two) times daily.   No facility-administered medications prior to visit.    Review of Systems  ***  {Insert previous labs (optional):23779} {See past labs  Heme  Chem  Endocrine  Serology  Results Review (optional):1}   Objective    There were no vitals taken for this visit. {Insert last BP/Wt (optional):23777}{See vitals history (optional):1}   Physical Exam    No results found for any visits on 11/30/22.  Assessment & Plan    There are no diagnoses linked to this encounter.   ***  No follow-ups on file.      I discussed the assessment and treatment plan with the patient  The patient was provided an opportunity to ask questions and all were answered. The patient agreed with the plan and demonstrated an understanding of the instructions.   The patient was advised to call back or seek an in-person evaluation if the symptoms worsen or if the condition fails to improve as anticipated.    Sherlyn Hay, DO  [facility] [phone] (phone) [phone] (fax)  [facility]

## 2022-12-08 NOTE — Telephone Encounter (Signed)
I haven't receive a PA for Famotidine. I thought he was referred to GI?

## 2022-12-09 ENCOUNTER — Ambulatory Visit: Payer: Managed Care, Other (non HMO) | Admitting: Family Medicine

## 2023-01-02 ENCOUNTER — Telehealth: Payer: Self-pay | Admitting: Gastroenterology

## 2023-01-02 ENCOUNTER — Ambulatory Visit: Payer: Managed Care, Other (non HMO) | Admitting: Gastroenterology

## 2023-01-02 VITALS — BP 133/86 | HR 98 | Temp 98.5°F | Ht 71.0 in | Wt 209.0 lb

## 2023-01-02 DIAGNOSIS — B182 Chronic viral hepatitis C: Secondary | ICD-10-CM

## 2023-01-02 DIAGNOSIS — Z1211 Encounter for screening for malignant neoplasm of colon: Secondary | ICD-10-CM

## 2023-01-02 MED ORDER — NA SULFATE-K SULFATE-MG SULF 17.5-3.13-1.6 GM/177ML PO SOLN
1.0000 | Freq: Once | ORAL | 0 refills | Status: AC
Start: 2023-01-02 — End: 2023-01-02

## 2023-01-02 NOTE — Progress Notes (Signed)
Gastroenterology Consultation  Referring Provider:     Sherlyn Hay, DO Primary Care Physician:  Thomas Hay, DO Primary Gastroenterologist:  Dr. Servando Snare     Reason for Consultation:     Hepatitis C        HPI:   Thomas Dorsey is a 47 y.o. y/o male referred for consultation & management of hepatitis C by Dr. Payton Mccallum, Monico Blitz, DO.  This patient comes in with a history of blood work showing positive hepatitis C with a viral load showing 26,000 international units/mL.  The patient's liver enzymes have shown:  Component     Latest Ref Rng 08/19/2021 10/19/2022  Albumin     4.1 - 5.1 g/dL 4.4  4.6   AST     0 - 40 IU/L 46 (H)  48 (H)   ALT     0 - 44 IU/L 55 (H)  74 (H)   Alkaline Phosphatase     44 - 121 IU/L 83  122 (H)   Total Bilirubin     0.0 - 1.2 mg/dL 0.5  0.7    The patient's genotype in 2017 was 1a.  Back then the patient was also noted to be susceptible to both hepatitis A and hepatitis B.  The patient was seen back in 2017 by infectious disease who had documented that they wanted to start him on 12 weeks of Harvoni and get him vaccinated for hepatitis A and hepatitis B.  At that time they had also counseled him on transmission of the virus. The patient reports that he has never gone through with the treatment for his hepatitis C.  He also reports that he did not go through with the vaccinations.  He states at that time the medications were very expensive.  Past Medical History:  Diagnosis Date   Back pain    ETOH abuse    Polysubstance abuse (HCC)     Past Surgical History:  Procedure Laterality Date   HAND SURGERY Right 2014    Prior to Admission medications   Medication Sig Start Date End Date Taking? Authorizing Provider  famotidine (PEPCID) 20 MG tablet Take 1 tablet (20 mg total) by mouth 2 (two) times daily. 10/19/22   Thomas Hay, DO  ibuprofen (ADVIL) 600 MG tablet Take 600 mg by mouth every 6 (six) hours as needed. Takes as needed     [provider]  lisinopril (ZESTRIL) 20 MG tablet Take 1 tablet (20 mg total) by mouth daily. 10/31/22   Thomas Hay, DO  pantoprazole (PROTONIX) 40 MG tablet Take 1 tablet (40 mg total) by mouth daily. 11/10/22   Thomas Hay, DO  triamcinolone ointment (KENALOG) 0.5 % Apply 1 application topically 2 (two) times daily. 12/10/15   Tommie Sams, DO    Family History  Problem Relation Age of Onset   Hypertension Mother    Heart disease Father    Sudden death Father    Colon cancer Maternal Aunt    Ovarian cancer Paternal Grandmother    Diabetes Paternal Grandfather      Social History   Tobacco Use   Smoking status: Former    Current packs/day: 1.00    Types: Cigarettes   Smokeless tobacco: Current    Types: Snuff  Vaping Use   Vaping status: Every Day  Substance Use Topics   Alcohol use: Not Currently    Alcohol/week: 0.0 standard drinks of alcohol    Comment: Hx ETOH  abuse   Drug use: Not Currently    Comment: History of polysubstance abuse.     Allergies as of 01/02/2023   (No Known Allergies)    Review of Systems:    All systems reviewed and negative except where noted in HPI.   Physical Exam:  There were no vitals taken for this visit. No LMP for male patient. General:   Alert,  Well-developed, well-nourished, pleasant and cooperative in NAD Head:  Normocephalic and atraumatic. Eyes:  Sclera clear, no icterus.   Conjunctiva pink. Ears:  Normal auditory acuity. Neck:  Supple; no masses or thyromegaly. Lungs:  Respirations even and unlabored.  Clear throughout to auscultation.   No wheezes, crackles, or rhonchi. No acute distress. Heart:  Regular rate and rhythm; no murmurs, clicks, rubs, or gallops. Abdomen:  Normal bowel sounds.  No bruits.  Soft, non-tender and non-distended without masses, hepatosplenomegaly or hernias noted.  No guarding or rebound tenderness.  Negative Carnett sign.   Rectal:  Deferred.  Pulses:  Normal pulses  noted. Extremities:  No clubbing or edema.  No cyanosis. Neurologic:  Alert and oriented x3;  grossly normal neurologically. Skin:  Intact without significant lesions or rashes.  No jaundice. Lymph Nodes:  No significant cervical adenopathy. Psych:  Alert and cooperative. Normal mood and affect.  Imaging Studies: No results found.  Assessment and Plan:   Thomas Dorsey is a 47 y.o. y/o male with genotype 1a hepatitis C with a viral load of 26,200 international units/mL.  The patient has had abnormal liver enzymes.  The patient will have his blood sent off for a FibroSure to see his fibrosis scale and then will be started on his treatment for his hepatitis C.  The patient has also not had a screening colonoscopy and will be set up for screening colonoscopy.  The patient has been explained the plan and agrees with it.    Midge Minium, MD. Clementeen Dorsey    Note: This dictation was prepared with Dragon dictation along with smaller phrase technology. Any transcriptional errors that result from this process are unintentional.

## 2023-01-02 NOTE — Addendum Note (Signed)
Addended by: Roena Malady on: 01/02/2023 03:56 PM   Modules accepted: Orders

## 2023-01-02 NOTE — Telephone Encounter (Signed)
The patient wife Alvis Lemmings) called in to double check the location of his appointment and she also wanted to know what her husband needs to bring to his appointment. I inform her the patient will needs his copay, id, and insurance information.

## 2023-01-04 LAB — HCV FIBROSURE
ALPHA 2-MACROGLOBULINS, QN: 247 mg/dL (ref 110–276)
ALT (SGPT) P5P: 146 [IU]/L — ABNORMAL HIGH (ref 0–55)
Apolipoprotein A-1: 152 mg/dL (ref 101–178)
Bilirubin, Total: 0.4 mg/dL (ref 0.0–1.2)
Fibrosis Score: 0.23 — ABNORMAL HIGH (ref 0.00–0.21)
GGT: 42 [IU]/L (ref 0–65)
Haptoglobin: 179 mg/dL (ref 23–355)
Necroinflammat Activity Score: 0.69 — ABNORMAL HIGH (ref 0.00–0.17)

## 2023-01-16 ENCOUNTER — Encounter: Payer: Self-pay | Admitting: Gastroenterology

## 2023-01-23 ENCOUNTER — Other Ambulatory Visit: Payer: Self-pay

## 2023-01-23 ENCOUNTER — Ambulatory Visit: Payer: Managed Care, Other (non HMO) | Admitting: General Practice

## 2023-01-23 ENCOUNTER — Ambulatory Visit
Admission: RE | Admit: 2023-01-23 | Discharge: 2023-01-23 | Disposition: A | Discharge status: Home / Self Care | Payer: Managed Care, Other (non HMO) | Attending: Gastroenterology | Admitting: Gastroenterology

## 2023-01-23 ENCOUNTER — Encounter: Payer: Self-pay | Admitting: Gastroenterology

## 2023-01-23 ENCOUNTER — Encounter
Admission: RE | Disposition: A | Discharge status: Home / Self Care | Payer: Self-pay | Source: Home / Self Care | Attending: Gastroenterology

## 2023-01-23 DIAGNOSIS — K219 Gastro-esophageal reflux disease without esophagitis: Secondary | ICD-10-CM | POA: Diagnosis not present

## 2023-01-23 DIAGNOSIS — Z1211 Encounter for screening for malignant neoplasm of colon: Secondary | ICD-10-CM | POA: Diagnosis present

## 2023-01-23 DIAGNOSIS — D123 Benign neoplasm of transverse colon: Secondary | ICD-10-CM | POA: Insufficient documentation

## 2023-01-23 DIAGNOSIS — K5140 Inflammatory polyps of colon without complications: Secondary | ICD-10-CM | POA: Diagnosis not present

## 2023-01-23 DIAGNOSIS — Z87891 Personal history of nicotine dependence: Secondary | ICD-10-CM | POA: Diagnosis not present

## 2023-01-23 DIAGNOSIS — I10 Essential (primary) hypertension: Secondary | ICD-10-CM | POA: Insufficient documentation

## 2023-01-23 DIAGNOSIS — K641 Second degree hemorrhoids: Secondary | ICD-10-CM | POA: Diagnosis not present

## 2023-01-23 DIAGNOSIS — K635 Polyp of colon: Secondary | ICD-10-CM

## 2023-01-23 HISTORY — DX: Inflammatory liver disease, unspecified: K75.9

## 2023-01-23 HISTORY — DX: Essential (primary) hypertension: I10

## 2023-01-23 HISTORY — PX: POLYPECTOMY: SHX5525

## 2023-01-23 HISTORY — PX: COLONOSCOPY WITH PROPOFOL: SHX5780

## 2023-01-23 HISTORY — DX: Gastro-esophageal reflux disease without esophagitis: K21.9

## 2023-01-23 SURGERY — COLONOSCOPY WITH PROPOFOL
Anesthesia: General | Site: Rectum

## 2023-01-23 MED ORDER — SODIUM CHLORIDE 0.9% FLUSH
3.0000 mL | INTRAVENOUS | Status: DC | PRN
Start: 2023-01-23 — End: 2023-01-23

## 2023-01-23 MED ORDER — PROPOFOL 10 MG/ML IV BOLUS
INTRAVENOUS | Status: AC
  Filled 2023-01-23: qty 40

## 2023-01-23 MED ORDER — STERILE WATER FOR IRRIGATION IR SOLN
Status: DC | PRN
  Administered 2023-01-23: 100 mL

## 2023-01-23 MED ORDER — PROPOFOL 10 MG/ML IV BOLUS
INTRAVENOUS | Status: DC | PRN
  Administered 2023-01-23: 30 mg via INTRAVENOUS
  Administered 2023-01-23: 50 mg via INTRAVENOUS
  Administered 2023-01-23: 40 mg via INTRAVENOUS
  Administered 2023-01-23: 50 mg via INTRAVENOUS
  Administered 2023-01-23: 60 mg via INTRAVENOUS
  Administered 2023-01-23: 150 mg via INTRAVENOUS

## 2023-01-23 MED ORDER — SODIUM CHLORIDE 0.9% FLUSH: 3.0000 mL | Freq: Two times a day (BID) | INTRAVENOUS | Status: DC

## 2023-01-23 MED ORDER — LIDOCAINE HCL (CARDIAC) PF 100 MG/5ML IV SOSY
PREFILLED_SYRINGE | INTRAVENOUS | Status: DC | PRN
  Administered 2023-01-23: 50 mg via INTRAVENOUS

## 2023-01-23 MED ORDER — SODIUM CHLORIDE 0.9 % IV SOLN: INTRAVENOUS | Status: DC

## 2023-01-23 SURGICAL SUPPLY — 6 items
GOWN CVR UNV OPN BCK APRN NK (MISCELLANEOUS) ×4 IMPLANT
KIT PRC NS LF DISP ENDO (KITS) ×2 IMPLANT
MANIFOLD NEPTUNE II (INSTRUMENTS) ×2 IMPLANT
SNARE COLD EXACTO (MISCELLANEOUS) IMPLANT
TRAP ETRAP POLY (MISCELLANEOUS) IMPLANT
WATER STERILE IRR 250ML POUR (IV SOLUTION) ×2 IMPLANT

## 2023-01-23 NOTE — Transfer of Care (Signed)
Immediate Anesthesia Transfer of Care Note  Patient: Thomas Dorsey  Procedure(s) Performed: COLONOSCOPY WITH BIOPSY (Rectum) POLYPECTOMY (Rectum)  Patient Location: PACU  Anesthesia Type: General  Level of Consciousness: awake, alert  and patient cooperative  Airway and Oxygen Therapy: Patient Spontanous Breathing and Patient connected to supplemental oxygen  Post-op Assessment: Post-op Vital signs reviewed, Patient's Cardiovascular Status Stable, Respiratory Function Stable, Patent Airway and No signs of Nausea or vomiting  Post-op Vital Signs: Reviewed and stable  Complications: No notable events documented.

## 2023-01-23 NOTE — Anesthesia Postprocedure Evaluation (Signed)
Anesthesia Post Note  Patient: Thomas Dorsey  Procedure(s) Performed: COLONOSCOPY WITH BIOPSY (Rectum) POLYPECTOMY (Rectum)  Patient location during evaluation: PACU Anesthesia Type: General Level of consciousness: awake and alert Pain management: pain level controlled Vital Signs Assessment: post-procedure vital signs reviewed and stable Respiratory status: spontaneous breathing, nonlabored ventilation and respiratory function stable Cardiovascular status: blood pressure returned to baseline and stable Postop Assessment: no apparent nausea or vomiting Anesthetic complications: no   No notable events documented.   Last Vitals:  Vitals:   01/23/23 0930 01/23/23 0933  BP: 106/61   Pulse: 93 91  Resp: 12 16  Temp:    SpO2: 96% 97%    Last Pain:  Vitals:   01/23/23 0930  TempSrc:   PainSc: 0-No pain                 Foye Deer

## 2023-01-23 NOTE — H&P (Signed)
Midge Minium, MD [facility] [address], [address] [place], [place] [address] Phone: [phone] Fax : [phone]  Primary Care Physician:  Sherlyn Hay, DO Primary Gastroenterologist:  Dr. Servando Snare  Pre-Procedure History & Physical: HPI:  Thomas Dorsey is a 48 y.o. male is here for a screening colonoscopy.   Past Medical History:  Diagnosis Date   Back pain    ETOH abuse    GERD (gastroesophageal reflux disease)    Hepatitis    Hepatits C   Hypertension    Polysubstance abuse [facility])     Past Surgical History:  Procedure Laterality Date   FOOT SURGERY Left 10/2021   HAND SURGERY Right 01/04/2012    Prior to Admission medications   Medication Sig Start Date End Date Taking? Authorizing Provider  lisinopril (ZESTRIL) 20 MG tablet Take 1 tablet (20 mg total) by mouth daily. 10/31/22  Yes Pardue, Monico Blitz, DO  pantoprazole (PROTONIX) 40 MG tablet Take 1 tablet (40 mg total) by mouth daily. 11/10/22  Yes Pardue, Monico Blitz, DO  triamcinolone ointment (KENALOG) 0.5 % Apply 1 application topically 2 (two) times daily. 12/10/15  Yes Cook, Jayce G, DO  famotidine (PEPCID) 20 MG tablet Take 1 tablet (20 mg total) by mouth 2 (two) times daily. Patient not taking: Reported on 01/16/2023 10/19/22   Sherlyn Hay, DO  ibuprofen (ADVIL) 600 MG tablet Take 600 mg by mouth every 6 (six) hours as needed. Takes as needed Patient not taking: Reported on 01/16/2023    [provider]    Allergies as of 01/02/2023 - Review Complete 01/02/2023  Allergen Reaction Noted   Nickel Hives, Itching, Rash, and Swelling 01/02/2023    Family History  Problem Relation Age of Onset   Hypertension Mother    Heart disease Father    Sudden death Father    Colon cancer Maternal Aunt    Ovarian cancer Paternal Grandmother    Diabetes Paternal Grandfather     Social History   Socioeconomic History   Marital status: Single    Spouse name: Not on file   Number of children: Not on file    Years of education: Not on file   Highest education level: Not on file  Occupational History   Not on file  Tobacco Use   Smoking status: Former    Current packs/day: 0.00    Average packs/day: 2.0 packs/day for 20.0 years (40.0 ttl pk-yrs)    Types: Cigarettes    Start date: 45    Quit date: 2019    Years since quitting: 6.0   Smokeless tobacco: Current    Types: Snuff  Vaping Use   Vaping status: Former  Substance and Sexual Activity   Alcohol use: Yes    Alcohol/week: 8.0 standard drinks of alcohol    Types: 8 Cans of beer per week    Comment: Hx ETOH abuse   Drug use: Not Currently    Comment: History of polysubstance abuse.    Sexual activity: Yes  Other Topics Concern   Not on file  Social History Narrative   Not on file   Social Drivers of Health   Financial Resource Strain: Not on file  Food Insecurity: Not on file  Transportation Needs: Not on file  Physical Activity: Not on file  Stress: Not on file  Social Connections: Not on file  Intimate Partner Violence: Not on file    Review of Systems: See HPI, otherwise negative ROS  Physical Exam: BP 124/78  Pulse 90   Temp 98 F (36.7 C) (Temporal)   Resp 18   Ht 5\' 11"  (1.803 m)   Wt 91.4 kg   SpO2 99%   BMI 28.10 kg/m  General:   Alert,  pleasant and cooperative in NAD Head:  Normocephalic and atraumatic. Neck:  Supple; no masses or thyromegaly. Lungs:  Clear throughout to auscultation.    Heart:  Regular rate and rhythm. Abdomen:  Soft, nontender and nondistended. Normal bowel sounds, without guarding, and without rebound.   Neurologic:  Alert and  oriented x4;  grossly normal neurologically.  Impression/Plan: Thomas Dorsey is now here to undergo a screening colonoscopy.  Risks, benefits, and alternatives regarding colonoscopy have been reviewed with the patient.  Questions have been answered.  All parties agreeable.

## 2023-01-23 NOTE — Op Note (Signed)
[facility] Gastroenterology Patient Name: Thomas Dorsey Procedure Date: 01/23/2023 9:01 AM MRN: [id number] Account #: [id number] Date of Birth: July 29, 1975 Admit Type: Outpatient Age: 48 Room: [facility] OR ROOM 01 Gender: Male Note Status: Finalized Instrument Name: [id number] Procedure:             Colonoscopy Indications:           Screening for colorectal malignant neoplasm Providers:             Midge Minium MD, MD Referring MD:          Sherlyn Hay (Referring MD) Medicines:             Propofol per Anesthesia Complications:         No immediate complications. Procedure:             Pre-Anesthesia Assessment:                        - Prior to the procedure, a History and Physical was                         performed, and patient medications and allergies were                         reviewed. The patient's tolerance of previous                         anesthesia was also reviewed. The risks and benefits                         of the procedure and the sedation options and risks                         were discussed with the patient. All questions were                         answered, and informed consent was obtained. Prior                         Anticoagulants: The patient has taken no anticoagulant                         or antiplatelet agents. ASA Grade Assessment: II - A                         patient with mild systemic disease. After reviewing                         the risks and benefits, the patient was deemed in                         satisfactory condition to undergo the procedure.                        After obtaining informed consent, the colonoscope was                         passed under direct vision. Throughout the procedure,  the patient's blood pressure, pulse, and oxygen                         saturations were monitored continuously. The                         Colonoscope was introduced through the anus  and                         advanced to the the cecum, identified by appendiceal                         orifice and ileocecal valve. The colonoscopy was                         performed without difficulty. The patient tolerated                         the procedure well. The quality of the bowel                         preparation was excellent. Findings:      The perianal and digital rectal examinations were normal.      Two sessile polyps were found in the transverse colon and ascending       colon. The polyps were 2 to 3 mm in size. These polyps were removed with       a cold snare. Resection and retrieval were complete.      Non-bleeding internal hemorrhoids were found during retroflexion. The       hemorrhoids were Grade II (internal hemorrhoids that prolapse but reduce       spontaneously). Impression:            - Two 2 to 3 mm polyps in the transverse colon and in                         the ascending colon, removed with a cold snare.                         Resected and retrieved.                        - Non-bleeding internal hemorrhoids. Recommendation:        - Discharge patient to home.                        - Resume previous diet.                        - Continue present medications.                        - Await pathology results.                        - If the pathology report reveals adenomatous tissue,                         then repeat the colonoscopy for surveillance in 7  years. Procedure Code(s):     --- Professional ---                        [phone], Colonoscopy, flexible; with removal of                         tumor(s), polyp(s), or other lesion(s) by snare                         technique Diagnosis Code(s):     --- Professional ---                        Z12.11, Encounter for screening for malignant neoplasm                         of colon                        D12.3, Benign neoplasm of transverse colon (hepatic                          flexure or splenic flexure) CPT copyright 2022 American Medical Association. All rights reserved. The codes documented in this report are preliminary and upon coder review may  be revised to meet current compliance requirements. Midge Minium MD, MD 01/23/2023 9:27:23 AM This report has been signed electronically. Number of Addenda: 0 Note Initiated On: 01/23/2023 9:01 AM Scope Withdrawal Time: 0 hours 7 minutes 31 seconds  Total Procedure Duration: 0 hours 14 minutes 22 seconds  Estimated Blood Loss:  Estimated blood loss: none.      [facility]

## 2023-01-23 NOTE — Anesthesia Preprocedure Evaluation (Addendum)
Anesthesia Evaluation  Patient identified by MRN, date of birth, ID band Patient awake    Reviewed: Allergy & Precautions, H&P , NPO status , Patient's Chart, lab work & pertinent test results  Airway Mallampati: II  TM Distance: >3 FB Neck ROM: full    Dental no notable dental hx.    Pulmonary former smoker   Pulmonary exam normal        Cardiovascular hypertension, Normal cardiovascular exam     Neuro/Psych negative neurological ROS  negative psych ROS   GI/Hepatic ,GERD  Controlled and Medicated,,(+) Hepatitis -  Endo/Other  negative endocrine ROS    Renal/GU negative Renal ROS  negative genitourinary   Musculoskeletal   Abdominal   Peds  Hematology negative hematology ROS (+)   Anesthesia Other Findings Past Medical History: No date: Back pain No date: ETOH abuse No date: GERD (gastroesophageal reflux disease) No date: Hepatitis     Comment:  Hepatits C No date: Hypertension No date: Polysubstance abuse [facility])  Past Surgical History: 10/2021: FOOT SURGERY; Left 01/04/2012: HAND SURGERY; Right  BMI    Body Mass Index: 29.01 kg/m      Reproductive/Obstetrics negative OB ROS                             Anesthesia Physical Anesthesia Plan  ASA: 2  Anesthesia Plan: General   Post-op Pain Management:    Induction: Intravenous  PONV Risk Score and Plan: Propofol infusion and TIVA  Airway Management Planned: Natural Airway  Additional Equipment:   Intra-op Plan:   Post-operative Plan:   Informed Consent: I have reviewed the patients History and Physical, chart, labs and discussed the procedure including the risks, benefits and alternatives for the proposed anesthesia with the patient or authorized representative who has indicated his/her understanding and acceptance.     Dental Advisory Given  Plan Discussed with: CRNA and Surgeon  Anesthesia Plan Comments:         Anesthesia Quick Evaluation

## 2023-01-24 ENCOUNTER — Encounter: Payer: Self-pay | Admitting: Gastroenterology

## 2023-01-24 LAB — SURGICAL PATHOLOGY

## 2023-01-25 ENCOUNTER — Encounter: Payer: Self-pay | Admitting: Gastroenterology

## 2023-02-01 ENCOUNTER — Ambulatory Visit: Payer: Self-pay

## 2023-02-01 ENCOUNTER — Other Ambulatory Visit: Payer: Self-pay | Admitting: Family Medicine

## 2023-02-01 ENCOUNTER — Telehealth: Payer: Self-pay | Admitting: Family Medicine

## 2023-02-01 DIAGNOSIS — K219 Gastro-esophageal reflux disease without esophagitis: Secondary | ICD-10-CM

## 2023-02-01 NOTE — Telephone Encounter (Signed)
Summary: rx clarification   Wife requesting clarification for pantoprazole prescription. Wife unsure if she should continue to reach out to Dr. Payton Mccallum for refills or if she should reach out to GI provider. Wife did go ahead and request refill just in case.  Wife requesting mychart message     Chief Complaint: Wife states [facility] has requested refill on Protonix and "needs additional information from provider." Requesting refill on Protonix. Pt. Has 4 pills left. Symptoms: n/a Frequency: n/a Pertinent Negatives: Patient denies  Disposition: [] ED /[] Urgent Care (no appt availability in office) / [] Appointment(In office/virtual)/ []  [place] [facility]/ [] Home Care/ [] Refused Recommended Disposition /[] [place] [facility]/ [x]  Follow-up with PCP Additional Notes: Please advise .  Reason for Disposition  [1] Caller has NON-URGENT medicine question about med that PCP prescribed AND [2] triager unable to answer question  Answer Assessment - Initial Assessment Questions 1. DRUG NAME: "What medicine do you need to have refilled?"     Protonix 2. REFILLS REMAINING: "How many refills are remaining?" (Note: The label on the medicine or pill bottle will show how many refills are remaining. If there are no refills remaining, then a renewal may be needed.)     0 - needs refills sent to [facility]. 3. EXPIRATION DATE: "What is the expiration date?" (Note: The label states when the prescription will expire, and thus can no longer be refilled.)     N/a 4. PRESCRIBING HCP: "Who prescribed it?" Reason: If prescribed by specialist, call should be referred to that group.     Dr. Payton Mccallum 5. SYMPTOMS: "Do you have any symptoms?"     N/a 6. PREGNANCY: "Is there any chance that you are pregnant?" "When was your last menstrual period?"     N/a  Protocols used: Medication Refill and Renewal Call-A-AH

## 2023-02-01 NOTE — Telephone Encounter (Signed)
error

## 2023-02-01 NOTE — Telephone Encounter (Signed)
Medication Refill -  Most Recent Primary Care Visit:  Provider: Sherlyn Hay  Department: [facility]  Visit Type: NEW PATIENT  Date: 10/19/2022  Medication: pantoprazole (PROTONIX) 40 MG tablet  Wife requesting additional refills, states they have been having issues with getting prescription refilled continuously and directly from pharmacy.    Has the patient contacted their pharmacy? Yes Out of refills, contact the office.   Is this the correct pharmacy for this prescription? Yes This is the patient's preferred pharmacy:  [facility] [place], [place] - [address] [place] [address] Phone: [phone] Fax: [phone]   Has the prescription been filled recently? No  Is the patient out of the medication? No  Has the patient been seen for an appointment in the last year OR does the patient have an upcoming appointment? Yes  Can we respond through MyChart? Yes  Agent: Please be advised that Rx refills may take up to 3 business days. We ask that you follow-up with your pharmacy.

## 2023-02-02 ENCOUNTER — Other Ambulatory Visit: Payer: Self-pay | Admitting: Family Medicine

## 2023-02-02 ENCOUNTER — Telehealth: Payer: Self-pay | Admitting: Family Medicine

## 2023-02-02 DIAGNOSIS — K219 Gastro-esophageal reflux disease without esophagitis: Secondary | ICD-10-CM

## 2023-02-02 MED ORDER — PANTOPRAZOLE SODIUM 40 MG PO TBEC: 40.0000 mg | DELAYED_RELEASE_TABLET | Freq: Every day | ORAL | 0 refills | Status: DC

## 2023-02-02 NOTE — Telephone Encounter (Signed)
Duplicate request, refilled 02/02/23.  Requested Prescriptions  Pending Prescriptions Disp Refills   pantoprazole (PROTONIX) 40 MG tablet 30 tablet 0    Sig: Take 1 tablet (40 mg total) by mouth daily.     Gastroenterology: Proton Pump Inhibitors Passed - 02/02/2023  9:04 AM      Passed - Valid encounter within last 12 months    Recent Outpatient Visits           3 months ago Encounter for medical examination to establish care   [facility] [place], [place], [place]

## 2023-02-02 NOTE — Telephone Encounter (Signed)
Refill for 90 days for insurance coverage. Routing for review.

## 2023-02-02 NOTE — Telephone Encounter (Signed)
Patient's wife [facility])  called to inquire about the refill request for Pantoprazole (Protonix) 40 mg. Advised her that medication was refilled today, 02/02/23 to [facility] pharmacy. She states that she will reach out to [facility] to inquire about the refill. She also states that she will contact Gastro for additional refills.

## 2023-02-02 NOTE — Telephone Encounter (Signed)
Medication Refill -  Most Recent Primary Care Visit:  Provider: Sherlyn Hay  Department: [facility]  Visit Type: NEW PATIENT  Date: 10/19/2022  Medication: pantoprazole (PROTONIX) 40 MG tablet  Wife requesting 90 day supply as insurance will only cover the 90 day supply. Wife is attempting to contact GI provider but requesting prescription be adjusted in the meantime. Pt is down to 2 pills.   Has the patient contacted their pharmacy? Yes  Is this the correct pharmacy for this prescription? Yes This is the patient's preferred pharmacy:  [id number] [place], [place] - [address] AT [facility] [address] [place] [address] Phone: [phone] Fax: [phone]  Has the prescription been filled recently? Yes  Is the patient out of the medication? No  Has the patient been seen for an appointment in the last year OR does the patient have an upcoming appointment? Yes  Can we respond through MyChart? Yes  Agent: Please be advised that Rx refills may take up to 3 business days. We ask that you follow-up with your pharmacy.

## 2023-02-02 NOTE — Telephone Encounter (Signed)
error

## 2023-02-06 ENCOUNTER — Telehealth: Payer: Self-pay

## 2023-02-06 MED ORDER — PANTOPRAZOLE SODIUM 20 MG PO TBEC: 20.0000 mg | DELAYED_RELEASE_TABLET | Freq: Every day | ORAL | 0 refills | Status: DC

## 2023-02-06 NOTE — Telephone Encounter (Signed)
Pt expressed concern regarding holding Pantoprazole, New Rx for 20mg  will be sent to cover the duration of Epclusa treatment... Pt advised to get back with PCP after completion of treatment

## 2023-02-06 NOTE — Addendum Note (Signed)
Addended by: Roena Malady on: 02/06/2023 11:41 AM   Modules accepted: Orders

## 2023-02-06 NOTE — Telephone Encounter (Signed)
Received fax from pharmacy requesting confirmation that we are aware that Pt being on Pantoprazole can decrease the Epclusa levels, does pt need to hold Protonix while on treatment? If so, can pt take anything in the meantime to help with GERD Sx?

## 2023-02-08 ENCOUNTER — Telehealth: Payer: Self-pay | Admitting: Gastroenterology

## 2023-02-08 NOTE — Telephone Encounter (Signed)
Patient still have not received medication (Protonix) 20 MG.

## 2023-02-08 NOTE — Telephone Encounter (Signed)
 error

## 2023-05-01 ENCOUNTER — Other Ambulatory Visit: Payer: Self-pay | Admitting: Family Medicine

## 2023-05-01 DIAGNOSIS — K219 Gastro-esophageal reflux disease without esophagitis: Secondary | ICD-10-CM

## 2023-05-05 MED ORDER — PANTOPRAZOLE SODIUM 40 MG PO TBEC: 40.0000 mg | DELAYED_RELEASE_TABLET | Freq: Every day | ORAL | 1 refills | Status: DC

## 2023-05-09 ENCOUNTER — Other Ambulatory Visit: Payer: Self-pay | Admitting: Gastroenterology

## 2023-10-02 ENCOUNTER — Other Ambulatory Visit: Payer: Self-pay | Admitting: Family Medicine

## 2023-10-02 DIAGNOSIS — I10 Essential (primary) hypertension: Secondary | ICD-10-CM

## 2023-10-03 NOTE — Telephone Encounter (Signed)
 Too soon for refill.  Requested Prescriptions  Pending Prescriptions Disp Refills   lisinopril  (ZESTRIL ) 20 MG tablet [Pharmacy Med Name: Lisinopril  20 MG Oral Tablet] 90 tablet 3    Sig: TAKE 1 TABLET BY MOUTH DAILY     Cardiovascular:  ACE Inhibitors Failed - 10/03/2023  4:20 PM      Failed - Cr in normal range and within 180 days    Creat  Date Value Ref Range Status  04/07/2015 0.92 0.60 - 1.35 mg/dL Final   Creatinine, Ser  Date Value Ref Range Status  10/19/2022 1.01 0.76 - 1.27 mg/dL Final         Failed - K in normal range and within 180 days    Potassium  Date Value Ref Range Status  10/19/2022 5.0 3.5 - 5.2 mmol/L Final  06/21/2012 3.0 (L) 3.5 - 5.1 mmol/L Final         Failed - Valid encounter within last 6 months    Recent Outpatient Visits   None            Passed - Patient is not pregnant      Passed - Last BP in normal range    BP Readings from Last 1 Encounters:  01/23/23 106/61

## 2023-10-27 ENCOUNTER — Other Ambulatory Visit: Payer: Self-pay | Admitting: Family Medicine

## 2023-10-27 DIAGNOSIS — K219 Gastro-esophageal reflux disease without esophagitis: Secondary | ICD-10-CM

## 2023-10-27 NOTE — Telephone Encounter (Signed)
 LOV- 10/19/2022 NOV- None LRF- 05/05/2023 pantoprazole  (PROTONIX ) 40 MG tablet [[id number]]   Order Details Dose: 40 mg Route: Oral Frequency: Daily  Dispense Quantity: 90 tablet (90 day supply) Refills: 1   Duration: -- Dispense As Written: No        Sig: Take 1 tablet (40 mg total) by mouth daily.       Start Date: 05/05/23 End Date: --  Written Date: 05/05/23 Expiration Date: 05/02/
